# Patient Record
Sex: Male | Born: 1985 | Race: White | Hispanic: No | Marital: Single | Smoking: Current every day smoker
Health system: Southern US, Community
[De-identification: ages and names within clinical notes are randomized; demographics above are authoritative.]

## PROBLEM LIST (undated history)

## (undated) DIAGNOSIS — F32A Depression, unspecified: Secondary | ICD-10-CM

## (undated) DIAGNOSIS — K219 Gastro-esophageal reflux disease without esophagitis: Secondary | ICD-10-CM

## (undated) DIAGNOSIS — F329 Major depressive disorder, single episode, unspecified: Secondary | ICD-10-CM

## (undated) HISTORY — PX: ABDOMINAL SURGERY: SHX537

## (undated) HISTORY — PX: OTHER SURGICAL HISTORY: SHX169

---
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
## 2006-12-04 ENCOUNTER — Emergency Department (HOSPITAL_COMMUNITY): Admission: EM | Admit: 2006-12-04 | Discharge: 2006-12-04 | Payer: Self-pay | Admitting: Family Medicine

## 2006-12-16 ENCOUNTER — Emergency Department (HOSPITAL_COMMUNITY): Admission: EM | Admit: 2006-12-16 | Discharge: 2006-12-16 | Payer: Self-pay | Admitting: Emergency Medicine

## 2007-10-21 ENCOUNTER — Emergency Department (HOSPITAL_COMMUNITY): Admission: EM | Admit: 2007-10-21 | Discharge: 2007-10-22 | Payer: Self-pay | Admitting: Emergency Medicine

## 2007-11-30 ENCOUNTER — Emergency Department (HOSPITAL_COMMUNITY): Admission: EM | Admit: 2007-11-30 | Discharge: 2007-11-30 | Payer: Self-pay | Admitting: Emergency Medicine

## 2007-11-30 ENCOUNTER — Ambulatory Visit: Payer: Self-pay | Admitting: Psychiatry

## 2007-12-01 ENCOUNTER — Inpatient Hospital Stay (HOSPITAL_COMMUNITY): Admission: AD | Admit: 2007-12-01 | Discharge: 2007-12-04 | Payer: Self-pay | Admitting: Psychiatry

## 2009-06-19 ENCOUNTER — Emergency Department (HOSPITAL_COMMUNITY): Admission: EM | Admit: 2009-06-19 | Discharge: 2009-06-19 | Payer: Self-pay | Admitting: Emergency Medicine

## 2010-03-03 ENCOUNTER — Emergency Department (HOSPITAL_COMMUNITY): Admission: EM | Admit: 2010-03-03 | Discharge: 2010-03-03 | Payer: Self-pay | Admitting: Emergency Medicine

## 2010-04-12 ENCOUNTER — Emergency Department: Payer: Self-pay | Admitting: Emergency Medicine

## 2010-09-30 ENCOUNTER — Emergency Department (HOSPITAL_COMMUNITY): Admission: EM | Admit: 2010-09-30 | Discharge: 2010-03-11 | Payer: Self-pay | Admitting: Emergency Medicine

## 2010-10-13 ENCOUNTER — Emergency Department (HOSPITAL_COMMUNITY)
Admission: EM | Admit: 2010-10-13 | Discharge: 2010-10-13 | Payer: Self-pay | Source: Home / Self Care | Admitting: Emergency Medicine

## 2010-10-18 ENCOUNTER — Emergency Department: Payer: Self-pay | Admitting: Emergency Medicine

## 2010-11-21 ENCOUNTER — Emergency Department (HOSPITAL_COMMUNITY)
Admission: EM | Admit: 2010-11-21 | Discharge: 2010-11-21 | Payer: Self-pay | Source: Home / Self Care | Admitting: Emergency Medicine

## 2010-11-21 ENCOUNTER — Emergency Department (HOSPITAL_COMMUNITY)
Admission: EM | Admit: 2010-11-21 | Discharge: 2010-11-22 | Payer: Self-pay | Source: Home / Self Care | Admitting: Emergency Medicine

## 2011-01-29 LAB — DIFFERENTIAL
Basophils Absolute: 0 10*3/uL (ref 0.0–0.1)
Basophils Relative: 1 % (ref 0–1)
Eosinophils Absolute: 0 10*3/uL (ref 0.0–0.7)
Monocytes Absolute: 0.4 10*3/uL (ref 0.1–1.0)
Neutro Abs: 7 10*3/uL (ref 1.7–7.7)

## 2011-01-29 LAB — CBC
HCT: 51.2 % (ref 39.0–52.0)
Hemoglobin: 17.5 g/dL — ABNORMAL HIGH (ref 13.0–17.0)
MCHC: 34.2 g/dL (ref 30.0–36.0)
MCV: 97.3 fL (ref 78.0–100.0)
RBC: 5.26 MIL/uL (ref 4.22–5.81)

## 2011-01-29 LAB — URINALYSIS, ROUTINE W REFLEX MICROSCOPIC
Glucose, UA: NEGATIVE mg/dL
Urobilinogen, UA: 1 mg/dL (ref 0.0–1.0)

## 2011-01-29 LAB — BASIC METABOLIC PANEL
Chloride: 103 mEq/L (ref 96–112)
Potassium: 4.7 mEq/L (ref 3.5–5.1)
Sodium: 138 mEq/L (ref 135–145)

## 2011-01-29 LAB — POCT I-STAT, CHEM 8
Creatinine, Ser: 1 mg/dL (ref 0.4–1.5)
Hemoglobin: 19 g/dL — ABNORMAL HIGH (ref 13.0–17.0)
Potassium: 4.5 mEq/L (ref 3.5–5.1)
Sodium: 139 mEq/L (ref 135–145)
TCO2: 26 mmol/L (ref 0–100)

## 2011-02-12 ENCOUNTER — Emergency Department: Payer: Self-pay | Admitting: Internal Medicine

## 2011-02-24 ENCOUNTER — Emergency Department (HOSPITAL_COMMUNITY)
Admission: EM | Admit: 2011-02-24 | Discharge: 2011-02-24 | Disposition: A | Discharge status: Home / Self Care | Payer: Self-pay | Attending: Emergency Medicine | Admitting: Emergency Medicine

## 2011-02-24 DIAGNOSIS — H60399 Other infective otitis externa, unspecified ear: Secondary | ICD-10-CM | POA: Insufficient documentation

## 2011-02-24 DIAGNOSIS — F329 Major depressive disorder, single episode, unspecified: Secondary | ICD-10-CM | POA: Insufficient documentation

## 2011-02-24 DIAGNOSIS — R220 Localized swelling, mass and lump, head: Secondary | ICD-10-CM | POA: Insufficient documentation

## 2011-02-24 DIAGNOSIS — J3489 Other specified disorders of nose and nasal sinuses: Secondary | ICD-10-CM | POA: Insufficient documentation

## 2011-02-24 DIAGNOSIS — F3289 Other specified depressive episodes: Secondary | ICD-10-CM | POA: Insufficient documentation

## 2011-02-24 DIAGNOSIS — L723 Sebaceous cyst: Secondary | ICD-10-CM | POA: Insufficient documentation

## 2011-02-24 DIAGNOSIS — H9209 Otalgia, unspecified ear: Secondary | ICD-10-CM | POA: Insufficient documentation

## 2011-02-24 DIAGNOSIS — R070 Pain in throat: Secondary | ICD-10-CM | POA: Insufficient documentation

## 2011-02-24 DIAGNOSIS — R221 Localized swelling, mass and lump, neck: Secondary | ICD-10-CM | POA: Insufficient documentation

## 2011-02-24 DIAGNOSIS — R51 Headache: Secondary | ICD-10-CM | POA: Insufficient documentation

## 2011-02-24 LAB — RAPID URINE DRUG SCREEN, HOSP PERFORMED
Amphetamines: NOT DETECTED
Barbiturates: NOT DETECTED
Benzodiazepines: POSITIVE — AB
Cocaine: NOT DETECTED
Opiates: NOT DETECTED
Tetrahydrocannabinol: POSITIVE — AB

## 2011-02-25 ENCOUNTER — Emergency Department (HOSPITAL_COMMUNITY)
Admission: EM | Admit: 2011-02-25 | Discharge: 2011-02-25 | Disposition: A | Discharge status: Home / Self Care | Payer: Self-pay | Attending: Emergency Medicine | Admitting: Emergency Medicine

## 2011-02-25 DIAGNOSIS — Z139 Encounter for screening, unspecified: Secondary | ICD-10-CM | POA: Insufficient documentation

## 2011-02-25 DIAGNOSIS — F1210 Cannabis abuse, uncomplicated: Secondary | ICD-10-CM | POA: Insufficient documentation

## 2011-03-08 NOTE — H&P (Signed)
NAMEJARION, [name]              ACCOUNT NO.:  [id number]   MEDICAL RECORD NO.:  [id number]          PATIENT TYPE:  IPS   LOCATION:  0307                          FACILITY[facility]  [facility]   PHYSICIAN:  Anselm Jungling, MD  DATE OF BIRTH:  12-23-1985   DATE OF ADMISSION:  12/01/2007  DATE OF DISCHARGE:                       PSYCHIATRIC ADMISSION ASSESSMENT   This is an involuntary admission to the services of Dr. Geralyn Flash.  Today's date is December 01, 2007.  He presented to the emergency  department at [facility] last night around 6 p.m.  He reported that he  had attempted to cut his wrist and right antecubital with a razor to  commit suicide.  He stated he was depressed over a break-up with a  girlfriend and stated that he has attempted suicide several times in the  past.  Of note, his UDS was positive for opiates, benzodiazepines and  marijuana he is not prescribed.  He states that he is addicted to  opiates.  He underwent left thumb surgery a couple years ago and he  states he does not abuse his Xanax but he was last prescribed his Xanax  about 2 years ago.  He gets most of his drugs off the street.  Today he  is irritable, he is difficult to have stay engaged in conversation and  basically he wants his Xanax.  At first he denies any ability to take  any antidepressants and eventually relents and states that he will give  Cymbalta a try.  He said in the past Celexa weirded him out.   PAST PSYCHIATRIC HISTORY:  He does have one prior admission in 2006 to  [facility].  He was admitted for polysubstance and  apparently did the [facility] Program.   SOCIAL HISTORY:  1. He is a high school graduate in 2007.  2. He works in Aeronautical engineer.  3. He lives with his mother.   FAMILY HISTORY:  He states his Mom is addicted to pain medicines as  well.   ALCOHOL AND DRUG HISTORY:  He reports being actually addicted for about  6 months or so.   PRIMARY CARE M[place]hai Venuto:   None.   In 2007, he is status post torn ligament repair in his left thumb and he  states that he needs to have that released.   MEDICAL PROBLEMS:  He did have a couple staples put in his right  antecubital and right wrist area.  His left wrist area does not have any  staples.  Otherwise, medically there are no current problems.   MEDICATIONS:  None are prescribed.   DRUG ALLERGIES:  He has no known drug allergies.   PHYSICAL EXAM:  He was medically cleared in the ED at [facility].  As  already stated, his urine drug screen was positive for opiates,  benzodiazepines, marijuana.   LABS:  His alcohol level was 7 and he had no other remarkable findings  on his labs.   On admission to our unit, his vital signs show he is 5 feet 9 inches,  weighs 129, temperature is 97.5, blood pressure  is 112/78 to 121/72,  pulse is 50-73, and respirations are 18.  He does have an old surgical  scar on his abdomen which sounds like it was a pyloric stenosis release  and he has no other remarkable findings.  This morning he fell, he is  reporting left wrist and thumb pain and, of course, this is his injured  hand anyway.  He does not have any swelling, he can touch his fingertips  to his thumb tip and his pain is felt to be reflective of his  withdrawal.   MENTAL STATUS EXAM:  He is alert and oriented.  He is surprisingly  appropriately groomed, dressed and appeared to at least be adequately  nourished.  His speech today is not pressured, his mood is depressed and  irritable.  His affect is anxious.  His thought processes are not clear  or rational.  He is being driven by his withdrawal.  Judgment and  insight are only fair.  Concentration and memory are fair.  Intelligence  is average.  He denies being actively suicidal or homicidal.  He denies  auditory visual hallucinations.  He is requesting Suboxone.   DIAGNOSES:  Axis I:  Opioid dependence, benzodiazepine abuse, rule out dependence.   Marijuana abuse, rule out dependence.  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  Axis II:  Rule out personality disorder.  Axis III:  Status post left thumb tendon repair in 2007.  Reports continued pain.  Axis IV:  Moderate.  Job loss, break-up, etcetera.  Axis V:  Global Assessment of Functioning 20.   PLAN:  1. Admit to help through detoxification from the opiates,      benzodiazepines and marijuana.  2. We will initiate therapy to help with his reported panic attacks.  3. He finally is agreeable to a trial of Cymbalta.  He is requesting      Suboxone and we will have a case manager try to find long-term drug      rehab for him consistent with his request for Suboxone.   ESTIMATED LENGTH OF STAY:  3 to 4 days.      Mickie Leonarda Salon, P.A.-C.      Anselm Jungling, MD  Electronically Signed    MD/MEDQ  D:  12/01/2007  T:  12/02/2007  Job:  [phone] [id number]

## 2011-03-11 NOTE — Op Note (Signed)
[name], [name]              ACCOUNT NO.:  [id number]   MEDICAL RECORD NO.:  [id number]          PATIENT TYPE:  INP   LOCATION:  1825                         FACILITY:  [facility]   PHYSICIAN:  Harvie Junior, M.D.   DATE OF BIRTH:  05-13-1986   DATE OF PROCEDURE:  08/17/2005  DATE OF DISCHARGE:  08/18/2005                                 OPERATIVE REPORT   PREOPERATIVE DIAGNOSIS:  Painful left thumb status post fall with volar  subluxation.   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   PROCEDURE:  1.  Repair of extensor pollicus brevis.  2.  Cuniorrhaphy of extensor pollicus longus with shortening to re-establish      tension.  3.  Pinning of the metacarpal phalangeal joint in a reduced position.  4.  Advancement of the adductor pollicus brevis.   SURGEON:  Harvie Junior, M.D.   ASSISTANT:  Marshia Ly, P.A.-C.   ANESTHESIA:  General.   BRIEF HISTORY:  [name] is an 25 year old boy who was initially seen  over at Urgent Care at [facility].  He was seen by a physician there who ran an x-  ray which showed volar subluxation.  He ultimately talked to the patient  about this, did a digital block, and tried manipulative reduction.  No  reduction was able to be achieved.  I came to see the patient and had a long  talk with him specifically about whether he had had any previous history of  injury to his thumb.  It was certainly seen that his thumb remained in a  flexed attitude, but he was hyperlax and it seemed like it could be manually  reduced.  He said this is significantly painful and his thumb had never  really been in this flexed position before.  With that information, it was  thought that there may be some cartilage which was trapped within the  metacarpal phalangeal joint, could possibly be volar plate trapped in the  joint, and we ultimately felt that surgical intervention would be  appropriate.  The patient was taken  for that procedure.   DESCRIPTION OF PROCEDURE:  The patient was taken to the operating room and  after adequate anesthesia was obtained with general anesthesia, the patient  was placed on the operating table.  The left hand was then evaluated and  examined under anesthesia with the fluoroscopy machine.  Basically, at that  point, it did look as though we could reduce the metacarpal phalangeal joint  but it would not stay in the reduced position.  It was, again, felt that  there was something that was trapped within the joint and that this needed  to be exposed and evaluated.  At that point, the arm was prepped and draped  in the usual sterile fashion.  The arm was exsanguinated and the tourniquet  was inflated to 250 mmHg.  Following this, a curved incision was made over  the dorsal aspect of the joint.  The subcutaneous tissues was incised.  The  extensor pollicus longus was clearly identified.  The extensor  pollicus  brevis was then searched for but was not apparent.  There was a tremendous  laxity within the dorsal capsular structures and they seemed to be somewhat  avulsed off the proximal phalanx.  With looking in the joint through a  radial rent, there was significant laxity of both the radial collateral  ligament and ulnar collateral ligament, although they were intact.  At this  point, it became clear that this was most likely  not a new injury and that  there was no fragments in the joint.  There was no volar plate in the joint  and that this was probably some chronic injury that may have had some acute  exacerbation.  At that point, we really were scrambling for treatment  options and felt that the most appropriate course of action was going to try  to be re-establish appropriate anatomy.  At that point, the extensor  pollicus brevis was identified.  There was basically some scar tissue which  had pulled distally and became hyperlax on the dorsal aspect of the joint.  The  extensor pollicus brevis was tracked proximally and although it was not  the perfect tendon, there were some fibers which were able to be brought  forward.  About 1 cm of adductor pollicus scar type tissue was debrided and  then this remaining portion was advanced to the proximal phalanx.  Prior to  doing this, the joint was manipulated into a reduced position and then  pinned percutaneously with a 62 K-wire.  Once that was accomplished, a 2.2  mm Arthrex suture anchor was used to repair the extensor pollicus brevis.  Once this was accomplished, the joint was now adequately reduced, the brevis  was repaired.  Unfortunately, the IP joint was now being held in somewhat of  a flexed position.  It was felt that this was most likely related to the  fact that with the MP joint previously in a significantly flexed posture  that there was stretching of the extensor pollicus longus.  Once the joint  was reduced, the extensor pollicus longus was now not at a functional  length.  At that point, it was felt that this was going to need to be  shortened and so a cm segment was taken out of the extensor pollicus longus,  the thumb was pulled into extension, and this was repaired with interrupted  nylon suture.  Once this was accomplished, attention was turned to the  abductor pollicus brevis which had previously been released.  The abductor  pollicus brevis was advanced back to the extensor pollicus brevis by way of  repairing over the dorsal capsule in this area and it was advanced to help  provide additional stability to the joint.  To this point, the wound was  copiously irrigated and suctioned dry.  The skin was closed with 4-0 nylon  interrupted suture.  The hand was placed into a thumb spica splint and the  patient was taken to the recovery room where she was noted to be in  satisfactory condition.  Estimated blood loss for the procedure was none. The most interesting part of the case came after the  case when I went out to  speak with the family and when we discussed with them what our thoughts were  about the case, at that point they said that he absolutely had hurt his hand  on multiple occasions before, that he had been to see orthopedic doctors in  the past, that he  always kept his hand in a flexed position and that they  were unclear why he had told us that this was not the case.  At any rate, I  think that the appropriate surgery had been performed for that scenario, but  it was just interesting that the patient had not shared with [place] this  significant history, although he had been pushed at this at length, that was  our initial thought when we looked at his original x-rays and examined him.  At any rate, he was discharged from the hospital that evening and he will be  followed through my office.      Harvie Junior, M.D.  Electronically Signed     JLG/MEDQ  D:  08/18/2005  T:  08/19/2005  Job:  [id number]

## 2011-03-11 NOTE — Discharge Summary (Signed)
NAMEJACY, [name]              ACCOUNT NO.:  [id number]   MEDICAL RECORD NO.:  [id number]          PATIENT TYPE:  IPS   LOCATION:  0307                          [facility]  [facility]   PHYSICIAN:  Anselm Jungling, MD  DATE OF BIRTH:  [birth year]   DATE OF ADMISSION:  12/01/2007  DATE OF DISCHARGE:  12/04/2007                               DISCHARGE SUMMARY   IDENTIFYING DATA AND REASON FOR ADMISSION:  This was an inpatient  psychiatric admission for [name], a 25 year old single white male who  presented with self-inflicted wrist cuts, and request for assistance  with drug addiction.  Please refer to the admission note for further  details pertaining to the symptoms, circumstances and history that led  to his hospitalization.  He was given initial Axis I diagnosis of opioid  dependence, benzodiazepine dependence, cannabis dependence.   MEDICAL AND LABORATORY:  The patient was medically treated for his wrist  wounds prior to being transferred to the inpatient psychiatric service.  These required sutures on both his left and right wrist.  He indicated  that he had made the suicide attempt because he was despondent about  losing his girlfriend and his job.   Otherwise, he was in good health without any active or chronic medical  problems.  He was medically and physically assessed by the psychiatric  nurse practitioner.  His wrist wounds were checked regularly, and they  remained clean and dry.   HOSPITAL COURSE:  The patient was admitted to the adult inpatient  psychiatric service.  He presented as a slender, but normally developed  young male who indicated that he was eager to receive assistance in  getting off of drugs.  He stated that he had become dependent on opiates  following a thumb injury and treatment for same.   His toxicology screen was positive for opiates, benzodiazepines, and  cannabis.   He was placed on the opiate withdrawal protocol and a Librium withdrawal  protocol.  He was involved in the therapeutic milieu including groups  and activities geared towards 12-step recovery.   He was initially evaluated by Dr. Lolly Mustache.  The undersigned assumed his  care on the final hospital day.  On that date, the patient was  complaining of nausea and emesis, but stated that overall I feel  better.  He denied any suicidal ideation.  He indicated that he felt  ready to go home, and made a clear request for discharge.  He indicated  that he was interested in outpatient chemical dependency treatment.  He  also agreed to continue with psychiatric outpatient care.   He had come to [place] on a regimen of Cymbalta 60 mg daily which was  continued throughout his stay and upon discharge.  For aftercare, the  patient agreed to follow up at the [facility], with an appointment  with their psychiatrist on December 13, 2007.   DISCHARGE MEDICATION:  Cymbalta 60 mg daily.  The patient was instructed  not to use any further Xanax.  He was encouraged to work with the  [facility] to find appropriate outpatient chemical dependency  treatment, and to attend 12-step groups in his area.   DISCHARGE DIAGNOSES:  AXIS I:  Opioid dependence, early remission,  polysubstance abuse, substance-induced mood disorder.  AXIS II:  Deferred.  AXIS III:  Status post wrist slash.  AXIS IV:  Stressors severe.  AXIS V:  Global Assessment of Functioning on discharge 55.      Anselm Jungling, MD  Electronically Signed     SPB/MEDQ  D:  12/10/2007  T:  12/11/2007  Job:  [id number]

## 2011-04-29 ENCOUNTER — Emergency Department: Payer: Self-pay | Admitting: Emergency Medicine

## 2011-05-31 ENCOUNTER — Emergency Department (HOSPITAL_COMMUNITY)
Admission: EM | Admit: 2011-05-31 | Discharge: 2011-06-01 | Disposition: A | Discharge status: Home / Self Care | Payer: Self-pay | Attending: Emergency Medicine | Admitting: Emergency Medicine

## 2011-05-31 DIAGNOSIS — H9190 Unspecified hearing loss, unspecified ear: Secondary | ICD-10-CM | POA: Insufficient documentation

## 2011-05-31 DIAGNOSIS — H9210 Otorrhea, unspecified ear: Secondary | ICD-10-CM | POA: Insufficient documentation

## 2011-05-31 DIAGNOSIS — F3289 Other specified depressive episodes: Secondary | ICD-10-CM | POA: Insufficient documentation

## 2011-05-31 DIAGNOSIS — F329 Major depressive disorder, single episode, unspecified: Secondary | ICD-10-CM | POA: Insufficient documentation

## 2011-05-31 DIAGNOSIS — R6884 Jaw pain: Secondary | ICD-10-CM | POA: Insufficient documentation

## 2011-05-31 DIAGNOSIS — Z9889 Other specified postprocedural states: Secondary | ICD-10-CM | POA: Insufficient documentation

## 2011-05-31 DIAGNOSIS — H60399 Other infective otitis externa, unspecified ear: Secondary | ICD-10-CM | POA: Insufficient documentation

## 2011-05-31 DIAGNOSIS — R599 Enlarged lymph nodes, unspecified: Secondary | ICD-10-CM | POA: Insufficient documentation

## 2011-05-31 DIAGNOSIS — H9209 Otalgia, unspecified ear: Secondary | ICD-10-CM | POA: Insufficient documentation

## 2011-05-31 DIAGNOSIS — F411 Generalized anxiety disorder: Secondary | ICD-10-CM | POA: Insufficient documentation

## 2011-06-09 ENCOUNTER — Emergency Department (HOSPITAL_COMMUNITY): Payer: Self-pay

## 2011-06-09 ENCOUNTER — Emergency Department (HOSPITAL_COMMUNITY)
Admission: EM | Admit: 2011-06-09 | Discharge: 2011-06-09 | Discharge status: Against Medical Advice | Payer: Self-pay | Attending: Emergency Medicine | Admitting: Emergency Medicine

## 2011-06-09 DIAGNOSIS — L723 Sebaceous cyst: Secondary | ICD-10-CM | POA: Insufficient documentation

## 2011-06-09 DIAGNOSIS — R51 Headache: Secondary | ICD-10-CM | POA: Insufficient documentation

## 2011-06-09 DIAGNOSIS — F3289 Other specified depressive episodes: Secondary | ICD-10-CM | POA: Insufficient documentation

## 2011-06-09 DIAGNOSIS — F329 Major depressive disorder, single episode, unspecified: Secondary | ICD-10-CM | POA: Insufficient documentation

## 2011-06-09 DIAGNOSIS — R6884 Jaw pain: Secondary | ICD-10-CM | POA: Insufficient documentation

## 2011-06-11 ENCOUNTER — Emergency Department (HOSPITAL_COMMUNITY)
Admission: EM | Admit: 2011-06-11 | Discharge: 2011-06-12 | Disposition: A | Discharge status: Home / Self Care | Payer: Self-pay | Attending: Emergency Medicine | Admitting: Emergency Medicine

## 2011-06-11 DIAGNOSIS — R6884 Jaw pain: Secondary | ICD-10-CM | POA: Insufficient documentation

## 2011-06-11 DIAGNOSIS — M26609 Unspecified temporomandibular joint disorder, unspecified side: Secondary | ICD-10-CM | POA: Insufficient documentation

## 2011-06-11 DIAGNOSIS — R51 Headache: Secondary | ICD-10-CM | POA: Insufficient documentation

## 2011-06-11 DIAGNOSIS — L723 Sebaceous cyst: Secondary | ICD-10-CM | POA: Insufficient documentation

## 2011-06-12 ENCOUNTER — Inpatient Hospital Stay (INDEPENDENT_AMBULATORY_CARE_PROVIDER_SITE_OTHER)
Admission: RE | Admit: 2011-06-12 | Discharge: 2011-06-12 | Disposition: A | Discharge status: Home / Self Care | Payer: Self-pay | Source: Ambulatory Visit | Attending: Family Medicine | Admitting: Family Medicine

## 2011-06-12 DIAGNOSIS — L723 Sebaceous cyst: Secondary | ICD-10-CM

## 2011-06-21 ENCOUNTER — Emergency Department (HOSPITAL_COMMUNITY)
Admission: EM | Admit: 2011-06-21 | Discharge: 2011-06-21 | Disposition: A | Discharge status: Home / Self Care | Payer: Self-pay | Attending: Emergency Medicine | Admitting: Emergency Medicine

## 2011-06-21 DIAGNOSIS — F1910 Other psychoactive substance abuse, uncomplicated: Secondary | ICD-10-CM | POA: Insufficient documentation

## 2011-06-21 LAB — DIFFERENTIAL
Basophils Relative: 0 % (ref 0–1)
Eosinophils Relative: 1 % (ref 0–5)
Lymphocytes Relative: 13 % (ref 12–46)
Monocytes Absolute: 0.5 10*3/uL (ref 0.1–1.0)
Monocytes Relative: 5 % (ref 3–12)
Neutro Abs: 7.5 10*3/uL (ref 1.7–7.7)
Neutrophils Relative %: 81 % — ABNORMAL HIGH (ref 43–77)

## 2011-06-21 LAB — RAPID URINE DRUG SCREEN, HOSP PERFORMED
Barbiturates: NOT DETECTED
Benzodiazepines: POSITIVE — AB
Cocaine: POSITIVE — AB
Tetrahydrocannabinol: POSITIVE — AB

## 2011-06-21 LAB — COMPREHENSIVE METABOLIC PANEL
AST: 29 U/L (ref 0–37)
BUN: 19 mg/dL (ref 6–23)
CO2: 24 mEq/L (ref 19–32)
Chloride: 107 mEq/L (ref 96–112)
GFR calc Af Amer: >60 mL/min (ref >60)
Glucose, Bld: 94 mg/dL (ref 70–99)

## 2011-06-21 LAB — CBC
HCT: 40.5 % (ref 39.0–52.0)
RDW: 12.4 % (ref 11.5–15.5)
WBC: 9.3 10*3/uL (ref 4.0–10.5)

## 2011-06-21 LAB — ETHANOL: Alcohol, Ethyl (B): <11 mg/dL (ref 0–11)

## 2011-06-21 LAB — ACETAMINOPHEN LEVEL: Acetaminophen (Tylenol), Serum: <15 ug/mL (ref 10–30)

## 2011-07-10 ENCOUNTER — Emergency Department (HOSPITAL_COMMUNITY): Payer: Self-pay

## 2011-07-10 ENCOUNTER — Emergency Department (HOSPITAL_COMMUNITY)
Admission: EM | Admit: 2011-07-10 | Discharge: 2011-07-10 | Disposition: A | Discharge status: Home / Self Care | Payer: Self-pay | Attending: Emergency Medicine | Admitting: Emergency Medicine

## 2011-07-10 DIAGNOSIS — Z87891 Personal history of nicotine dependence: Secondary | ICD-10-CM | POA: Insufficient documentation

## 2011-07-10 DIAGNOSIS — S6390XA Sprain of unspecified part of unspecified wrist and hand, initial encounter: Secondary | ICD-10-CM | POA: Insufficient documentation

## 2011-07-15 LAB — I-STAT 8, (EC8 V) (CONVERTED LAB)
BUN: 14
Bicarbonate: 26.4 — ABNORMAL HIGH
Chloride: 109
Operator id: 192351
pCO2, Ven: 48.2
pH, Ven: 7.346 — ABNORMAL HIGH

## 2011-07-15 LAB — RAPID URINE DRUG SCREEN, HOSP PERFORMED
Amphetamines: NOT DETECTED
Benzodiazepines: POSITIVE — AB
Cocaine: NOT DETECTED
Opiates: POSITIVE — AB
Tetrahydrocannabinol: POSITIVE — AB

## 2011-07-15 LAB — ETHANOL: Alcohol, Ethyl (B): 7

## 2011-08-21 ENCOUNTER — Emergency Department (HOSPITAL_COMMUNITY)
Admission: EM | Admit: 2011-08-21 | Discharge: 2011-08-21 | Disposition: A | Discharge status: Home / Self Care | Payer: Self-pay | Attending: Emergency Medicine | Admitting: Emergency Medicine

## 2011-08-21 ENCOUNTER — Emergency Department (HOSPITAL_COMMUNITY): Payer: Self-pay

## 2011-08-21 DIAGNOSIS — F329 Major depressive disorder, single episode, unspecified: Secondary | ICD-10-CM | POA: Insufficient documentation

## 2011-08-21 DIAGNOSIS — S6990XA Unspecified injury of unspecified wrist, hand and finger(s), initial encounter: Secondary | ICD-10-CM | POA: Insufficient documentation

## 2011-08-21 DIAGNOSIS — S6710XA Crushing injury of unspecified finger(s), initial encounter: Secondary | ICD-10-CM | POA: Insufficient documentation

## 2011-08-21 DIAGNOSIS — W230XXA Caught, crushed, jammed, or pinched between moving objects, initial encounter: Secondary | ICD-10-CM | POA: Insufficient documentation

## 2011-08-21 DIAGNOSIS — Z79899 Other long term (current) drug therapy: Secondary | ICD-10-CM | POA: Insufficient documentation

## 2011-08-21 DIAGNOSIS — M79609 Pain in unspecified limb: Secondary | ICD-10-CM | POA: Insufficient documentation

## 2011-08-21 DIAGNOSIS — S61209A Unspecified open wound of unspecified finger without damage to nail, initial encounter: Secondary | ICD-10-CM | POA: Insufficient documentation

## 2011-08-21 DIAGNOSIS — F3289 Other specified depressive episodes: Secondary | ICD-10-CM | POA: Insufficient documentation

## 2011-11-24 ENCOUNTER — Encounter (HOSPITAL_COMMUNITY): Payer: Self-pay | Admitting: Emergency Medicine

## 2011-11-24 ENCOUNTER — Emergency Department (HOSPITAL_COMMUNITY)
Admission: EM | Admit: 2011-11-24 | Discharge: 2011-11-25 | Disposition: A | Discharge status: Home / Self Care | Payer: Self-pay | Attending: Emergency Medicine | Admitting: Emergency Medicine

## 2011-11-24 DIAGNOSIS — N390 Urinary tract infection, site not specified: Secondary | ICD-10-CM | POA: Insufficient documentation

## 2011-11-24 DIAGNOSIS — R109 Unspecified abdominal pain: Secondary | ICD-10-CM | POA: Insufficient documentation

## 2011-11-24 DIAGNOSIS — R112 Nausea with vomiting, unspecified: Secondary | ICD-10-CM | POA: Insufficient documentation

## 2011-11-24 LAB — URINALYSIS, ROUTINE W REFLEX MICROSCOPIC
Ketones, ur: 15 mg/dL — AB
Nitrite: NEGATIVE
Protein, ur: NEGATIVE mg/dL
Urobilinogen, UA: 1 mg/dL (ref 0.0–1.0)

## 2011-11-24 LAB — BASIC METABOLIC PANEL
BUN: 12 mg/dL (ref 6–23)
Creatinine, Ser: 0.88 mg/dL (ref 0.50–1.35)
GFR calc Af Amer: >90 mL/min (ref >90)
GFR calc non Af Amer: >90 mL/min (ref >90)
Potassium: 3.2 mEq/L — ABNORMAL LOW (ref 3.5–5.1)

## 2011-11-24 LAB — DIFFERENTIAL
Basophils Absolute: 0 10*3/uL (ref 0.0–0.1)
Basophils Relative: 0 % (ref 0–1)
Eosinophils Absolute: 0 10*3/uL (ref 0.0–0.7)
Monocytes Relative: 7 % (ref 3–12)
Neutrophils Relative %: 75 % (ref 43–77)

## 2011-11-24 LAB — URINE MICROSCOPIC-ADD ON

## 2011-11-24 LAB — CBC
Hemoglobin: 17 g/dL (ref 13.0–17.0)
MCH: 32.8 pg (ref 26.0–34.0)
MCHC: 36.4 g/dL — ABNORMAL HIGH (ref 30.0–36.0)
Platelets: 246 10*3/uL (ref 150–400)
RDW: 12.4 % (ref 11.5–15.5)

## 2011-11-24 MED ORDER — POTASSIUM CHLORIDE CRYS ER 20 MEQ PO TBCR
20.0000 meq | EXTENDED_RELEASE_TABLET | Freq: Two times a day (BID) | ORAL | Status: DC
  Administered 2011-11-24: 20 meq via ORAL
  Filled 2011-11-24: qty 1

## 2011-11-24 MED ORDER — GI COCKTAIL ~~LOC~~
30.0000 mL | Freq: Once | ORAL | Status: AC
  Administered 2011-11-24: 30 mL via ORAL
  Filled 2011-11-24: qty 30

## 2011-11-24 MED ORDER — KETOROLAC TROMETHAMINE 30 MG/ML IJ SOLN
30.0000 mg | Freq: Once | INTRAMUSCULAR | Status: AC
  Administered 2011-11-24: 30 mg via INTRAVENOUS
  Filled 2011-11-24: qty 1

## 2011-11-24 MED ORDER — POTASSIUM CHLORIDE 20 MEQ PO PACK: 20.0000 meq | PACK | Freq: Two times a day (BID) | ORAL | Status: DC

## 2011-11-24 MED ORDER — PROMETHAZINE HCL 25 MG PO TABS: 25.0000 mg | ORAL_TABLET | Freq: Four times a day (QID) | ORAL | Status: AC | PRN

## 2011-11-24 MED ORDER — ONDANSETRON HCL 4 MG/2ML IJ SOLN
4.0000 mg | Freq: Once | INTRAMUSCULAR | Status: AC
  Administered 2011-11-24: 4 mg via INTRAVENOUS
  Filled 2011-11-24: qty 2

## 2011-11-24 MED ORDER — SODIUM CHLORIDE 0.9 % IV BOLUS (SEPSIS)
1000.0000 mL | Freq: Once | INTRAVENOUS | Status: AC
  Administered 2011-11-24: 1000 mL via INTRAVENOUS

## 2011-11-24 MED ORDER — CIPROFLOXACIN HCL 500 MG PO TABS: 500.0000 mg | ORAL_TABLET | Freq: Two times a day (BID) | ORAL | Status: AC

## 2011-11-24 MED ORDER — CIPROFLOXACIN IN D5W 400 MG/200ML IV SOLN
400.0000 mg | Freq: Once | INTRAVENOUS | Status: AC
  Administered 2011-11-24: 400 mg via INTRAVENOUS
  Filled 2011-11-24: qty 200

## 2011-11-24 NOTE — ED Provider Notes (Signed)
History     CSN: [id number]  Arrival date & time 11/24/11  4098   First MD Initiated Contact with Patient 11/24/11 2131      Chief Complaint  Patient presents with  . Abdominal Pain    HPI The patient presents with 3-4 weeks of abdominal pain and nausea.  He notes his symptoms began gradually.  Since onset there has been waxing/waning.  No clear exacerbating or alleviating factors, including by mouth intake.  He notes mild subjective fever, nausea persistently.  He notes anorexia since that time.  No chest pain, no dyspnea, no musculoskeletal changes. The patient is a smoker, denies illicit substances or alcohol  History reviewed. No pertinent past medical history.  History reviewed. No pertinent past surgical history.  No family history on file.  History  Substance Use Topics  . Smoking status: Never Smoker   . Smokeless tobacco: Not on file  . Alcohol Use: No   patient does smoke    Review of Systems  Constitutional:       Per HPI, otherwise negative  HENT:       Per HPI, otherwise negative  Eyes: Negative.   Respiratory:       Per HPI, otherwise negative  Cardiovascular:       Per HPI, otherwise negative  Gastrointestinal: Positive for nausea, vomiting and diarrhea. Negative for constipation and rectal pain.  Genitourinary: Negative.   Musculoskeletal:       Per HPI, otherwise negative  Skin: Negative.   Neurological: Negative for syncope.    Allergies  Ibuprofen and Tylenol  Home Medications  No current outpatient prescriptions on file.  BP 115/65  Pulse 78  Temp(Src) 98.7 F (37.1 C) (Oral)  SpO2 99%  Physical Exam  Nursing note and vitals reviewed. Constitutional: He is oriented to person, place, and time. He appears well-developed. No distress.  HENT:  Head: Normocephalic and atraumatic.  Eyes: Conjunctivae and EOM are normal.  Cardiovascular: Normal rate and regular rhythm.   Pulmonary/Chest: Effort normal. No stridor. No respiratory  distress.  Abdominal: Soft. Normal appearance. He exhibits no distension. There is no hepatosplenomegaly. There is tenderness in the epigastric area. There is no rebound and no CVA tenderness. No hernia.  Musculoskeletal: He exhibits no edema.  Neurological: He is alert and oriented to person, place, and time.  Skin: Skin is warm and dry.  Psychiatric: He has a normal mood and affect.    ED Course  Procedures (including critical care time)  Labs Reviewed  URINALYSIS, ROUTINE W REFLEX MICROSCOPIC - Abnormal; Notable for the following:    Color, Urine AMBER (*) BIOCHEMICALS MAY BE AFFECTED BY COLOR   APPearance CLOUDY (*)    Glucose, UA 100 (*)    Bilirubin Urine SMALL (*)    Ketones, ur 15 (*)    Leukocytes, UA SMALL (*)    All other components within normal limits  CBC - Abnormal; Notable for the following:    MCHC 36.4 (*)    All other components within normal limits  BASIC METABOLIC PANEL - Abnormal; Notable for the following:    Potassium 3.2 (*)    All other components within normal limits  DIFFERENTIAL  URINE MICROSCOPIC-ADD ON   No results found.   No diagnosis found.    MDM  This previously well young male now presents with several weeks of intermittent mid abdominal pain with nausea and vomiting.  On exam the patient is in no distress, with unremarkable vital signs.  Patient  is mild tenderness to palpation about his left mid abdomen.  The patient's labs are notable for evidence of dehydration, hypokalemia, ketonuria and possibly urinary tract infection.  A prolonged discussion was conducted with the patient and his mother regarding the possible causes of his symptoms.  Given the patient's stable vital signs, and the chronic description of his complaints he is appropriate for continued evaluation as an outpatient.  He was advised to follow up with gastroenterology in the primary care physician.  The patient notes he is monitored concerns and he had difficulty seeing a  gastroenterologist.  He was discharged on antibiotics, antiemetics.        Gerhard Munch, MD 11/24/11 2356

## 2011-11-24 NOTE — ED Notes (Signed)
PT. REPORTS INTERMITTENT MID ABDOMINAL PAIN AND VOMITTING FOR SEVERAL WEEKS ,DENIES DIARRHEA OR FEVER.

## 2011-11-25 MED ORDER — HYDROMORPHONE HCL PF 1 MG/ML IJ SOLN
0.5000 mg | Freq: Once | INTRAMUSCULAR | Status: AC
  Administered 2011-11-25: 0.5 mg via INTRAVENOUS
  Filled 2011-11-25: qty 1

## 2011-11-30 ENCOUNTER — Encounter (HOSPITAL_COMMUNITY): Payer: Self-pay | Admitting: *Deleted

## 2011-11-30 ENCOUNTER — Emergency Department (HOSPITAL_COMMUNITY)
Admission: EM | Admit: 2011-11-30 | Discharge: 2011-11-30 | Disposition: A | Discharge status: Home / Self Care | Payer: Self-pay | Attending: Emergency Medicine | Admitting: Emergency Medicine

## 2011-11-30 DIAGNOSIS — Y9351 Activity, roller skating (inline) and skateboarding: Secondary | ICD-10-CM | POA: Insufficient documentation

## 2011-11-30 DIAGNOSIS — F411 Generalized anxiety disorder: Secondary | ICD-10-CM | POA: Insufficient documentation

## 2011-11-30 DIAGNOSIS — IMO0002 Reserved for concepts with insufficient information to code with codable children: Secondary | ICD-10-CM | POA: Insufficient documentation

## 2011-11-30 DIAGNOSIS — S0003XA Contusion of scalp, initial encounter: Secondary | ICD-10-CM | POA: Insufficient documentation

## 2011-11-30 DIAGNOSIS — S01511A Laceration without foreign body of lip, initial encounter: Secondary | ICD-10-CM

## 2011-11-30 DIAGNOSIS — S01501A Unspecified open wound of lip, initial encounter: Secondary | ICD-10-CM | POA: Insufficient documentation

## 2011-11-30 DIAGNOSIS — R51 Headache: Secondary | ICD-10-CM | POA: Insufficient documentation

## 2011-11-30 MED ORDER — CEPHALEXIN 500 MG PO CAPS: 500.0000 mg | ORAL_CAPSULE | Freq: Four times a day (QID) | ORAL | Status: AC

## 2011-11-30 MED ORDER — CEPHALEXIN 500 MG PO CAPS: 500.0000 mg | ORAL_CAPSULE | Freq: Four times a day (QID) | ORAL | Status: DC

## 2011-11-30 NOTE — ED Notes (Signed)
Pt was skateboarding when he fell and a piece of wood impaled into his upper lip, pt states that he removed this wood.  Pt has a laceration that appears to be through his right upper lip.  Bleeding controlled at this time.  Pt is tearful in triage.  Last tetanus was in December 2012

## 2011-11-30 NOTE — ED Notes (Signed)
Pt laceration has been dressed.

## 2011-11-30 NOTE — ED Provider Notes (Signed)
History     CSN: [id number]  Arrival date & time 11/30/11  1733   First MD Initiated Contact with Patient 11/30/11 1823      Chief Complaint  Patient presents with  . Facial Laceration    (Consider location/radiation/quality/duration/timing/severity/associated sxs/prior treatment) Patient is a 26 y.o. male presenting with skin laceration. The history is provided by the patient.  Laceration  The incident occurred less than 1 hour ago. The laceration is located on the face. The laceration is 4 cm in size. The pain is at a severity of 10/10. The pain is moderate. The pain has been constant since onset.  Pt states he was skateboarding when he fell, hitting his head on the skateboard. States laceration in right upper lip, through and through. Pain to right face. Abrasion to the right hip. No LOC. No headache, nausea, vomiting.   History reviewed. No pertinent past medical history.  Past Surgical History  Procedure Date  . Thumb surgery   . Abdominal surgery     No family history on file.  History  Substance Use Topics  . Smoking status: Never Smoker   . Smokeless tobacco: Not on file  . Alcohol Use: No      Review of Systems  Skin:       Laceration, contusion  All other systems reviewed and are negative.    Allergies  Ibuprofen; Tylenol; and Ultram  Home Medications   Current Outpatient Rx  Name Route Sig Dispense Refill  . CIPROFLOXACIN HCL 500 MG PO TABS Oral Take 1 tablet (500 mg total) by mouth every 12 (twelve) hours. 10 tablet 0  . CLONAZEPAM 1 MG PO TABS Oral Take 1 mg by mouth 3 (three) times daily as needed. anxiety    . PROMETHAZINE HCL 25 MG PO TABS Oral Take 1 tablet (25 mg total) by mouth every 6 (six) hours as needed for nausea. 12 tablet 0    BP 109/65  Pulse 102  Temp(Src) 98.1 F (36.7 C) (Oral)  Resp 20  SpO2 98%  Physical Exam  Nursing note and vitals reviewed. Constitutional: He is oriented to person, place, and time. He appears  well-developed and well-nourished.       Tearful, anxious.  HENT:  Head: Normocephalic.  Nose: Nose normal.  Mouth/Throat: Oropharynx is clear and moist.       Contusion to the right cheek. Laceration about 4cm to the right upper lip, just touching the vermillian border, through and through. No dental injury. Abrasion to the right upper gum.   Eyes: Conjunctivae are normal. Pupils are equal, round, and reactive to light.  Neck: Neck supple.  Cardiovascular: Normal rate, regular rhythm and normal heart sounds.   Pulmonary/Chest: Effort normal and breath sounds normal.  Neurological: He is alert and oriented to person, place, and time.  Skin: Skin is warm and dry.  Psychiatric:       anxious    ED Course  Procedures (including critical care time)  LACERATION REPAIR Performed by: Lottie Mussel Authorized by: Jaynie Crumble A Consent: Verbal consent obtained. Risks and benefits: risks, benefits and alternatives were discussed Consent given by: patient Patient identity confirmed: provided demographic data Prepped and Draped in normal sterile fashion Wound explored  Laceration Location: right upper lip  Laceration Length: 4cm  No Foreign Bodies seen or palpated  Anesthesia: local infiltration 2% w/o epinephrine  Anesthetic total: 3 ml  Irrigation method: syringe Amount of cleaning: standard  Skin closure: prolene 6.0, vicryl rapid 5.0  Number of sutures: 7 skin closure, one through vermillian border, 2 on mucosal surface Technique: simple interrupted.  Patient tolerance: Patient tolerated the procedure well with no immediate complications.  Exam does not demonstrate any signs of serious head trauma, no LOC. Laceration repaired. Will d/c home with pain meds and antibiotics  7:08 PM Pt discharged, requesting pain medications. States he is allergic to ibuprofen, tylenol. Vicodin, percocet. Asking for medicinal marijuana as well. Pt appears intoxicated at this  time. Although appropriate, i do suspect pt has taken something prior his visit and do not feel it is safe to prescribed narcotics at this time.   No diagnosis found.    MDM          Lottie Mussel, PA 11/30/11 1903  Lottie Mussel, PA 11/30/11 1909

## 2011-12-01 NOTE — ED Provider Notes (Signed)
Medical screening examination/treatment/procedure(s) were performed by non-physician practitioner and as supervising physician I was immediately available for consultation/collaboration.  Toy Baker, MD 12/01/11 [phone]

## 2011-12-13 ENCOUNTER — Emergency Department (HOSPITAL_COMMUNITY)
Admission: EM | Admit: 2011-12-13 | Discharge: 2011-12-13 | Disposition: A | Discharge status: Home / Self Care | Payer: Self-pay | Source: Home / Self Care | Attending: Family Medicine | Admitting: Family Medicine

## 2012-11-22 ENCOUNTER — Encounter (HOSPITAL_COMMUNITY): Payer: Self-pay | Admitting: *Deleted

## 2012-11-22 ENCOUNTER — Emergency Department (HOSPITAL_COMMUNITY)
Admission: EM | Admit: 2012-11-22 | Discharge: 2012-11-22 | Disposition: A | Discharge status: Home / Self Care | Payer: Self-pay | Attending: Emergency Medicine | Admitting: Emergency Medicine

## 2012-11-22 ENCOUNTER — Emergency Department (HOSPITAL_COMMUNITY): Payer: Self-pay

## 2012-11-22 DIAGNOSIS — Z8659 Personal history of other mental and behavioral disorders: Secondary | ICD-10-CM | POA: Insufficient documentation

## 2012-11-22 DIAGNOSIS — S8990XA Unspecified injury of unspecified lower leg, initial encounter: Secondary | ICD-10-CM | POA: Insufficient documentation

## 2012-11-22 DIAGNOSIS — F1210 Cannabis abuse, uncomplicated: Secondary | ICD-10-CM | POA: Insufficient documentation

## 2012-11-22 DIAGNOSIS — Y929 Unspecified place or not applicable: Secondary | ICD-10-CM | POA: Insufficient documentation

## 2012-11-22 DIAGNOSIS — W1809XA Striking against other object with subsequent fall, initial encounter: Secondary | ICD-10-CM | POA: Insufficient documentation

## 2012-11-22 DIAGNOSIS — W540XXA Bitten by dog, initial encounter: Secondary | ICD-10-CM | POA: Insufficient documentation

## 2012-11-22 DIAGNOSIS — M25579 Pain in unspecified ankle and joints of unspecified foot: Secondary | ICD-10-CM

## 2012-11-22 DIAGNOSIS — S61409A Unspecified open wound of unspecified hand, initial encounter: Secondary | ICD-10-CM | POA: Insufficient documentation

## 2012-11-22 DIAGNOSIS — Z8719 Personal history of other diseases of the digestive system: Secondary | ICD-10-CM | POA: Insufficient documentation

## 2012-11-22 DIAGNOSIS — S99929A Unspecified injury of unspecified foot, initial encounter: Secondary | ICD-10-CM | POA: Insufficient documentation

## 2012-11-22 DIAGNOSIS — Y9389 Activity, other specified: Secondary | ICD-10-CM | POA: Insufficient documentation

## 2012-11-22 HISTORY — DX: Gastro-esophageal reflux disease without esophagitis: K21.9

## 2012-11-22 HISTORY — DX: Depression, unspecified: F32.A

## 2012-11-22 HISTORY — DX: Major depressive disorder, single episode, unspecified: F32.9

## 2012-11-22 MED ORDER — NAPROXEN 250 MG PO TABS
500.0000 mg | ORAL_TABLET | Freq: Once | ORAL | Status: AC
  Administered 2012-11-22: 500 mg via ORAL
  Filled 2012-11-22: qty 2

## 2012-11-22 MED ORDER — NAPROXEN 500 MG PO TABS: 500.0000 mg | ORAL_TABLET | Freq: Two times a day (BID) | ORAL | Status: DC

## 2012-11-22 NOTE — ED Notes (Signed)
Pt given discharge paperwork with no additional questions; pt verbalized understanding of discharge; VSS; e-signature obtained; 

## 2012-11-22 NOTE — ED Provider Notes (Signed)
History   Scribed for Gavin Pound. Ghim, MD, the patient was seen in room TR07C/TR07C . This chart was scribed by Lewanda Rife.   CSN: [id number]  Arrival date & time 11/22/12  1623   First MD Initiated Contact with Patient 11/22/12 1805      Chief Complaint  Patient presents with  . Animal Bite  . Joint Swelling    (Consider location/radiation/quality/duration/timing/severity/associated sxs/prior treatment) HPI [name] is a 27 y.o. male who presents to the Emergency Department complaining of pit bull bite and mild left ankle pain onset 4 days. Pt states he was breaking up a pit bull fight and a dog scraped his 2nd finger of right hand making a puncture wound. Pt reports falling backwards and hitting the back of his left ankle on a curb. Pt reports he is familiar with the dog and shots are up to date. Pt has not taken any medications to treat pain or swelling at home. Pt reports tetanus immunization within the last 10 years.   Past Medical History  Diagnosis Date  . Depression   . GERD (gastroesophageal reflux disease)     Past Surgical History  Procedure Date  . Thumb surgery   . Abdominal surgery     No family history on file.  History  Substance Use Topics  . Smoking status: Never Smoker   . Smokeless tobacco: Not on file  . Alcohol Use: No      Review of Systems  Constitutional: Negative.   HENT: Negative.   Respiratory: Negative.   Cardiovascular: Negative.   Gastrointestinal: Negative.   Musculoskeletal: Positive for joint swelling (left ankle).  Skin: Positive for wound (dog bite 2nd finger of right hand ).  Neurological: Negative.   Hematological: Negative.   Psychiatric/Behavioral: Negative.   All other systems reviewed and are negative.   A complete 10 system review of systems was obtained and all systems are negative except as noted in the HPI and PMH.   Allergies  Ibuprofen; Tylenol; and Ultram  Home Medications  No current  outpatient prescriptions on file.  BP 125/70  Pulse 103  Temp 98 F (36.7 C) (Oral)  Resp 20  Ht 5\' 9"  (1.753 m)  Wt 155 lb (70.308 kg)  BMI 22.89 kg/m2  SpO2 98%  Physical Exam  Nursing note and vitals reviewed. Constitutional: He is oriented to person, place, and time. He appears well-developed and well-nourished. No distress.  HENT:  Head: Normocephalic and atraumatic.  Eyes: Conjunctivae normal are normal.  Neck: Neck supple. No tracheal deviation present.  Cardiovascular: Normal rate and intact distal pulses.   Pulmonary/Chest: Effort normal. No respiratory distress.  Abdominal: Soft.  Musculoskeletal: Normal range of motion. He exhibits tenderness.       Left ankle: He exhibits swelling (mild). He exhibits no ecchymosis and no deformity. tenderness. Achilles tendon exhibits pain (mild).       Hands: Neurological: He is alert and oriented to person, place, and time.  Skin: Skin is warm and dry.  Psychiatric: He has a normal mood and affect. His behavior is normal.    ED Course  Procedures (including critical care time)  Labs Reviewed - No data to display Dg Ankle Complete Left  11/22/2012  *RADIOLOGY REPORT*  Clinical Data: Dog bite.  Ankle pain.  LEFT ANKLE COMPLETE - 3+ VIEW  Comparison: None.  Findings: Mild soft tissue swelling about the ankle.  No bony abnormality.  No fracture, subluxation or dislocation.  Joint spaces are maintained.  No radiopaque foreign body or soft tissue gas.  IMPRESSION: No acute bony abnormality.   Original Report Authenticated By: Charlett Nose, M.D.      No diagnosis found.  Ankle pain, posterior, after fall and striking back of leg on unknown object.  Splint, crutches, pain medications. Healing wound from dog bite on hand, no indication of infection.  MDM    I personally performed the services described in this documentation, which was scribed in my presence. The recorded information has been reviewed and is  accurate.       Jimmye Norman, NP 11/23/12 [phone]

## 2012-11-22 NOTE — ED Notes (Signed)
Pt has wound to right hand with healing noted; pt denying any numbness or tingling to extremity; states "it just hurts"

## 2012-11-22 NOTE — Progress Notes (Signed)
Orthopedic Tech Progress Note Patient Details:  [name] 1986/03/23 [id number]  Ortho Devices Type of Ortho Device: Crutches;Ankle Air splint Ortho Device/Splint Location: (L) LE Ortho Device/Splint Interventions: Application   Jennye Moccasin 11/22/2012, 6:41 PM

## 2012-11-22 NOTE — ED Notes (Signed)
Pt was bitten by a unknown pit bull in [place] 4 days ago.  Pt has swelling to right finger since bite and also reports swelling to left ankle with pain in ankle.  Pt denies any trauma to left ankle.

## 2012-11-25 NOTE — ED Provider Notes (Signed)
Medical screening examination/treatment/procedure(s) were performed by non-physician practitioner and as supervising physician I was immediately available for consultation/collaboration.   Cataleah Stites Y. Demontray Franta, MD 11/25/12 0829 

## 2013-05-18 ENCOUNTER — Encounter (HOSPITAL_COMMUNITY): Payer: Self-pay | Admitting: Emergency Medicine

## 2013-05-18 ENCOUNTER — Emergency Department (HOSPITAL_COMMUNITY)
Admission: EM | Admit: 2013-05-18 | Discharge: 2013-05-18 | Disposition: A | Discharge status: Home / Self Care | Payer: No Typology Code available for payment source | Attending: Emergency Medicine | Admitting: Emergency Medicine

## 2013-05-18 ENCOUNTER — Emergency Department (HOSPITAL_COMMUNITY): Payer: No Typology Code available for payment source

## 2013-05-18 DIAGNOSIS — Z8659 Personal history of other mental and behavioral disorders: Secondary | ICD-10-CM | POA: Insufficient documentation

## 2013-05-18 DIAGNOSIS — F17200 Nicotine dependence, unspecified, uncomplicated: Secondary | ICD-10-CM | POA: Insufficient documentation

## 2013-05-18 DIAGNOSIS — S4980XA Other specified injuries of shoulder and upper arm, unspecified arm, initial encounter: Secondary | ICD-10-CM | POA: Insufficient documentation

## 2013-05-18 DIAGNOSIS — S46909A Unspecified injury of unspecified muscle, fascia and tendon at shoulder and upper arm level, unspecified arm, initial encounter: Secondary | ICD-10-CM | POA: Insufficient documentation

## 2013-05-18 DIAGNOSIS — Z8719 Personal history of other diseases of the digestive system: Secondary | ICD-10-CM | POA: Insufficient documentation

## 2013-05-18 DIAGNOSIS — Y9389 Activity, other specified: Secondary | ICD-10-CM | POA: Insufficient documentation

## 2013-05-18 DIAGNOSIS — M25512 Pain in left shoulder: Secondary | ICD-10-CM

## 2013-05-18 DIAGNOSIS — Y92410 Unspecified street and highway as the place of occurrence of the external cause: Secondary | ICD-10-CM | POA: Insufficient documentation
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
## 2013-05-18 NOTE — ED Notes (Signed)
Patient refuses to have discharge vitals taken.
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
## 2014-03-16 ENCOUNTER — Encounter (HOSPITAL_COMMUNITY): Payer: Self-pay | Admitting: Emergency Medicine

## 2014-03-16 ENCOUNTER — Emergency Department (HOSPITAL_COMMUNITY)
Admission: EM | Admit: 2014-03-16 | Discharge: 2014-03-16 | Disposition: A | Discharge status: Home / Self Care | Payer: Self-pay | Attending: Emergency Medicine | Admitting: Emergency Medicine

## 2014-03-16 DIAGNOSIS — Z8659 Personal history of other mental and behavioral disorders: Secondary | ICD-10-CM | POA: Insufficient documentation

## 2014-03-16 DIAGNOSIS — H6590 Unspecified nonsuppurative otitis media, unspecified ear: Secondary | ICD-10-CM | POA: Insufficient documentation

## 2014-03-16 DIAGNOSIS — H6692 Otitis media, unspecified, left ear: Secondary | ICD-10-CM

## 2014-03-16 DIAGNOSIS — R000 Tachycardia, unspecified: Secondary | ICD-10-CM | POA: Insufficient documentation

## 2014-03-16 DIAGNOSIS — R42 Dizziness and giddiness: Secondary | ICD-10-CM | POA: Insufficient documentation

## 2014-03-16 DIAGNOSIS — Z8719 Personal history of other diseases of the digestive system: Secondary | ICD-10-CM | POA: Insufficient documentation

## 2014-03-16 DIAGNOSIS — F17200 Nicotine dependence, unspecified, uncomplicated: Secondary | ICD-10-CM | POA: Insufficient documentation

## 2014-03-16 DIAGNOSIS — M5430 Sciatica, unspecified side: Secondary | ICD-10-CM | POA: Insufficient documentation

## 2014-03-16 MED ORDER — HYDROMORPHONE HCL PF 1 MG/ML IJ SOLN
0.5000 mg | Freq: Once | INTRAMUSCULAR | Status: AC
  Administered 2014-03-16: 0.5 mg via INTRAMUSCULAR
  Filled 2014-03-16: qty 1

## 2014-03-16 MED ORDER — AMOXICILLIN 500 MG PO CAPS: 500.0000 mg | ORAL_CAPSULE | Freq: Three times a day (TID) | ORAL | Status: DC

## 2014-03-16 MED ORDER — OXYCODONE-ACETAMINOPHEN 5-325 MG PO TABS: 1.0000 | ORAL_TABLET | Freq: Four times a day (QID) | ORAL | Status: DC | PRN

## 2014-03-16 MED ORDER — KETOROLAC TROMETHAMINE 60 MG/2ML IM SOLN
60.0000 mg | Freq: Once | INTRAMUSCULAR | Status: AC
  Administered 2014-03-16: 60 mg via INTRAMUSCULAR
  Filled 2014-03-16: qty 2

## 2014-03-16 NOTE — ED Provider Notes (Signed)
CSN: [id number]     Arrival date & time 03/16/14  1629 History   First MD Initiated Contact with Patient 03/16/14 1705     Chief Complaint  Patient presents with  . Otalgia  . Dizziness     (Consider location/radiation/quality/duration/timing/severity/associated sxs/prior Treatment) Patient is a 28 y.o. male presenting with ear pain and dizziness. The history is provided by the patient.  Otalgia Location:  Left Associated symptoms: no abdominal pain, no diarrhea, no headaches, no rash and no vomiting   Dizziness Associated symptoms: no chest pain, no diarrhea, no headaches, no nausea, no shortness of breath and no vomiting    patient's had pain in his left ear for the last 2 days. He states that there is been some drainage. He states he's had some episodes of dizziness/lightheadedness. No fevers. No numbness or weakness. No confusion. He also states he's had pain in his back going down the back of his right leg. No numbness or weakness. Worse with movements. He has had some pain in the back before but usually does not go down the leg. He states he did have tripped the other day but was not hurting at that time. No dysuria.  Past Medical History  Diagnosis Date  . Depression   . GERD (gastroesophageal reflux disease)    Past Surgical History  Procedure Laterality Date  . Thumb surgery    . Abdominal surgery     History reviewed. No pertinent family history. History  Substance Use Topics  . Smoking status: Current Every Day Smoker -- 0.50 packs/day for 6 years    Types: Cigarettes  . Smokeless tobacco: Never Used  . Alcohol Use: No    Review of Systems  Constitutional: Negative for activity change and appetite change.  HENT: Positive for ear pain.   Eyes: Negative for pain.  Respiratory: Negative for chest tightness and shortness of breath.   Cardiovascular: Negative for chest pain and leg swelling.  Gastrointestinal: Negative for nausea, vomiting, abdominal pain and  diarrhea.  Genitourinary: Negative for flank pain.  Musculoskeletal: Positive for back pain and gait problem. Negative for neck stiffness.  Skin: Negative for rash.  Neurological: Positive for dizziness and light-headedness. Negative for weakness, numbness and headaches.  Psychiatric/Behavioral: Negative for behavioral problems.      Allergies  Tylenol; Ibuprofen; and Ultram  Home Medications   Prior to Admission medications   Not on File   BP 115/69  Pulse 71  Temp(Src) 98.4 F (36.9 C) (Oral)  Resp 15  SpO2 98% Physical Exam  Constitutional: He appears well-developed.  Patient appears uncomfortable  HENT:  Head: Normocephalic.  Purulent drainage with purulent effusion and left canal  Eyes: Pupils are equal, round, and reactive to light.  Neck: Neck supple.  Cardiovascular:  Tachycardia  Pulmonary/Chest: Effort normal and breath sounds normal.  Abdominal: Soft. Bowel sounds are normal.  Musculoskeletal: He exhibits tenderness.  Tenderness in right SI area. Some pain with straight leg raise on right. Mild tenderness to posterior right upper leg. Neurovascular intact over right foot. Flexion extension intact at ankle. Dorsalis pedis pulse intact  Neurological: He is alert.    ED Course  Procedures (including critical care time) Labs Review Labs Reviewed - No data to display  Imaging Review No results found.   EKG Interpretation   Date/Time:  Sunday Mar 16 2014 16:39:10 EDT Ventricular Rate:  110 PR Interval:  126 QRS Duration: 90 QT Interval:  308 QTC Calculation: 416 R Axis:   92 Text  Interpretation:  Sinus tachycardia Rightward axis Abnormal ECG  Confirmed by Rubin Payor  MD, Harrold Donath [phone]) on 03/16/2014 5:29:24 PM      MDM   Final diagnoses:  Otitis media of left ear  Sciatica    Patient with right\like pain. Likely sciatica. Also has left draining ear infection. 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 Will  give antibiotics and pain meds and discharged home    Juliet Rude. Rubin Payor, MD 03/17/14 [phone]

## 2014-03-16 NOTE — Discharge Instructions (Signed)
Otitis Media, Adult Otitis media is redness, soreness, and swelling (inflammation) of the middle ear. Otitis media may be caused by allergies or, most commonly, by infection. Often it occurs as a complication of the common cold. SIGNS AND SYMPTOMS Symptoms of otitis media may include:  Earache.  Fever.  Ringing in your ear.  Headache.  Leakage of fluid from the ear. DIAGNOSIS To diagnose otitis media, your health care provider will examine your ear with an otoscope. This is an instrument that allows your health care provider to see into your ear in order to examine your eardrum. Your health care provider also will ask you questions about your symptoms. TREATMENT  Typically, otitis media resolves on its own within 3 5 days. Your health care provider may prescribe medicine to ease your symptoms of pain. If otitis media does not resolve within 5 days or is recurrent, your health care provider may prescribe antibiotic medicines if he or she suspects that a bacterial infection is the cause. HOME CARE INSTRUCTIONS   Take your medicine as directed until it is gone, even if you feel better after the first few days.  Only take over-the-counter or prescription medicines for pain, discomfort, or fever as directed by your health care provider.  Follow up with your health care provider as directed. SEEK MEDICAL CARE IF:  You have otitis media only in one ear or bleeding from your nose or both.  You notice a lump on your neck.  You are not getting better in 3 5 days.  You feel worse instead of better. SEEK IMMEDIATE MEDICAL CARE IF:   You have pain that is not controlled with medicine.  You have swelling, redness, or pain around your ear or stiffness in your neck.  You notice that part of your face is paralyzed.  You notice that the bone behind your ear (mastoid) is tender when you touch it. MAKE SURE YOU:   Understand these instructions.  Will watch your condition.  Will get help  right away if you are not doing well or get worse. Document Released: 07/15/2004 Document Revised: 07/31/2013 Document Reviewed: 05/07/2013 [facility] Patient Information 2014 [place], [place].  Sciatica Sciatica is pain, weakness, numbness, or tingling along the path of the sciatic nerve. The nerve starts in the lower back and runs down the back of each leg. The nerve controls the muscles in the lower leg and in the back of the knee, while also providing sensation to the back of the thigh, lower leg, and the sole of your foot. Sciatica is a symptom of another medical condition. For instance, nerve damage or certain conditions, such as a herniated disk or bone spur on the spine, pinch or put pressure on the sciatic nerve. This causes the pain, weakness, or other sensations normally associated with sciatica. Generally, sciatica only affects one side of the body. CAUSES   Herniated or slipped disc.  Degenerative disk disease.  A pain disorder involving the narrow muscle in the buttocks (piriformis syndrome).  Pelvic injury or fracture.  Pregnancy.  Tumor (rare). SYMPTOMS  Symptoms can vary from mild to very severe. The symptoms usually travel from the low back to the buttocks and down the back of the leg. Symptoms can include:  Mild tingling or dull aches in the lower back, leg, or hip.  Numbness in the back of the calf or sole of the foot.  Burning sensations in the lower back, leg, or hip.  Sharp pains in the lower back, leg, or  hip.  Leg weakness.  Severe back pain inhibiting movement. These symptoms may get worse with coughing, sneezing, laughing, or prolonged sitting or standing. Also, being overweight may worsen symptoms. DIAGNOSIS  Your caregiver will perform a physical exam to look for common symptoms of sciatica. He or she may ask you to do certain movements or activities that would trigger sciatic nerve pain. Other tests may be performed to find the cause of the sciatica.  These may include:  Blood tests.  X-rays.  Imaging tests, such as an MRI or CT scan. TREATMENT  Treatment is directed at the cause of the sciatic pain. Sometimes, treatment is not necessary and the pain and discomfort goes away on its own. If treatment is needed, your caregiver may suggest:  Over-the-counter medicines to relieve pain.  Prescription medicines, such as anti-inflammatory medicine, muscle relaxants, or narcotics.  Applying heat or ice to the painful area.  Steroid injections to lessen pain, irritation, and inflammation around the nerve.  Reducing activity during periods of pain.  Exercising and stretching to strengthen your abdomen and improve flexibility of your spine. Your caregiver may suggest losing weight if the extra weight makes the back pain worse.  Physical therapy.  Surgery to eliminate what is pressing or pinching the nerve, such as a bone spur or part of a herniated disk. HOME CARE INSTRUCTIONS   Only take over-the-counter or prescription medicines for pain or discomfort as directed by your caregiver.  Apply ice to the affected area for 20 minutes, 3 4 times a day for the first 48 72 hours. Then try heat in the same way.  Exercise, stretch, or perform your usual activities if these do not aggravate your pain.  Attend physical therapy sessions as directed by your caregiver.  Keep all follow-up appointments as directed by your caregiver.  Do not wear high heels or shoes that do not provide proper support.  Check your mattress to see if it is too soft. A firm mattress may lessen your pain and discomfort. SEEK IMMEDIATE MEDICAL CARE IF:   You lose control of your bowel or bladder (incontinence).  You have increasing weakness in the lower back, pelvis, buttocks, or legs.  You have redness or swelling of your back.  You have a burning sensation when you urinate.  You have pain that gets worse when you lie down or awakens you at night.  Your pain  is worse than you have experienced in the past.  Your pain is lasting longer than 4 weeks.  You are suddenly losing weight without reason. MAKE SURE YOU:  Understand these instructions.  Will watch your condition.  Will get help right away if you are not doing well or get worse. Document Released: 10/04/2001 Document Revised: 04/10/2012 Document Reviewed: 02/19/2012 [facility] Patient Information 2014 [place], [place].

## 2014-03-16 NOTE — ED Notes (Signed)
Pt states that he has had ear pain and drainage on the left side. Pt states that he has had dizziness for 2 days. Pt states that it occurs constantly. Pt has rt hip pain as well.

## 2014-03-17 ENCOUNTER — Emergency Department (HOSPITAL_COMMUNITY)
Admission: EM | Admit: 2014-03-17 | Discharge: 2014-03-17 | Disposition: A | Discharge status: Home / Self Care | Payer: Self-pay | Attending: Emergency Medicine | Admitting: Emergency Medicine

## 2014-03-17 ENCOUNTER — Encounter (HOSPITAL_COMMUNITY): Payer: Self-pay | Admitting: Emergency Medicine

## 2014-03-17 DIAGNOSIS — Z79899 Other long term (current) drug therapy: Secondary | ICD-10-CM | POA: Insufficient documentation

## 2014-03-17 DIAGNOSIS — Z8719 Personal history of other diseases of the digestive system: Secondary | ICD-10-CM | POA: Insufficient documentation

## 2014-03-17 DIAGNOSIS — M533 Sacrococcygeal disorders, not elsewhere classified: Secondary | ICD-10-CM

## 2014-03-17 DIAGNOSIS — Z8659 Personal history of other mental and behavioral disorders: Secondary | ICD-10-CM | POA: Insufficient documentation

## 2014-03-17 DIAGNOSIS — F17200 Nicotine dependence, unspecified, uncomplicated: Secondary | ICD-10-CM | POA: Insufficient documentation

## 2014-03-17 NOTE — ED Notes (Signed)
He was here last night for R hip pain. He states the pain is worse and he is upset because he did not have an xray. He was given a Rx for pain medication which he did not get filled

## 2014-03-17 NOTE — ED Provider Notes (Signed)
CSN: [id number]     Arrival date & time 03/17/14  [id number] History   None   This chart was scribed for Bernard CowmanHeather Tilak Oakley PA-C, a non-physician practitioner working with Bernard ChickMartha K Linker, MD by Bernard RifeAlexandra Hamilton, ED Scribe. This patient was seen in room TR08C/TR08C and the patient's care was started at 10:55 AM      Chief Complaint  Patient presents with  . Hip Pain     (Consider location/radiation/quality/duration/timing/severity/associated sxs/prior Treatment) The history is provided by the patient. No language interpreter was used.   HPI Comments: Bernard PrinceJustin L Hamilton is a 28 y.o. male who presents to the Emergency Department complaining of constant right hip pain onset 2 days. Pt was evaluated for the same this morning in ED. States he rides a dirt bike. Describes pain as gradually worsening in severity. States he cannot afford his prescriptions for pain medication. Denies trying any alleviating factors. Reports pain exacerbated by standing and by touch. Denies associated recent injury, and fall/trauma. Denies urinary or fecal incontinence, urinary retention, perineal/saddle paresthesias, fever, and PMHx of cancer. 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     Past Medical History  Diagnosis Date  . Depression   . GERD (gastroesophageal reflux disease)    Past Surgical History  Procedure Laterality Date  . Thumb surgery    . Abdominal surgery     No family history on file. History  Substance Use Topics  . Smoking status: Current Every Day Smoker -- 0.50 packs/day for 6 years    Types: Cigarettes  . Smokeless tobacco: Never Used  . Alcohol Use: No    Review of Systems  Constitutional: Negative for fever.  Genitourinary: Negative.   Musculoskeletal: Positive for back pain.  Skin: Negative for color change and rash.      Allergies  Tylenol; Ibuprofen; and Ultram  Home Medications   Prior to Admission medications   Medication Sig Start Date End Date Taking?  Authorizing Provider  amoxicillin (AMOXIL) 500 MG capsule Take 1 capsule (500 mg total) by mouth 3 (three) times daily. 03/16/14   [name], MD  oxyCODONE-acetaminophen (PERCOCET/ROXICET) 5-325 MG per tablet Take 1-2 tablets by mouth every 6 (six) hours as needed for severe pain. 03/16/14   [name], MD   There were no vitals taken for this visit. Physical Exam  Nursing note and vitals reviewed. Constitutional: He is oriented to person, place, and time. He appears well-developed and well-nourished. No distress.  Patient appears uncomfortable  HENT:  Head: Normocephalic and atraumatic.  Eyes: Conjunctivae and EOM are normal.  Neck: Neck supple.  Cardiovascular: Normal rate.   Pulmonary/Chest: Effort normal and breath sounds normal. No respiratory distress.  Abdominal: Soft. Bowel sounds are normal.  Musculoskeletal: Normal range of motion. He exhibits tenderness.       Right hip: Normal. He exhibits normal range of motion, no crepitus and no deformity.  Tenderness in right SI area. Some pain with straight leg raise on right. Mild tenderness to posterior right upper leg. Neurovascular intact over right foot. Flexion extension intact at ankle. Dorsalis pedis pulse intact  Neurological: He is alert and oriented to person, place, and time.  Skin: Skin is warm and dry. No erythema.  No erythema, edema, or no warmth of the right hip or lumbar spine   Psychiatric: He has a normal mood and affect. His behavior is normal.    ED Course  Procedures (including critical care time) COORDINATION OF CARE:  Nursing notes reviewed. Vital signs reviewed. Initial pt interview and examination performed.   Filed Vitals:   03/17/14 0946  BP: 118/79  Pulse: 98  Temp: 98.6 F (37 C)  TempSrc: Oral  Resp: 16  Height: 5\' 9"  (1.753 m)  Weight: 158 lb (71.668 kg)  SpO2: 100%    11:00 AM-Discussed work up plan with pt at bedside, which includes No orders of the defined types were  placed in this encounter.  . Pt agrees with plan.  11:17 AM Offered an x-ray of the right hip, but pt declined.  Treatment plan initiated:Medications - No data to display   Initial diagnostic testing ordered.       Labs Review Labs Reviewed - No data to display  Imaging Review No results found.   EKG Interpretation None      MDM   Final diagnoses:  None   Patient presenting with pain of the SI joint.  Pain radiating down right leg.  He was seen here earlier this morning and diagnosed with Sciatica.  He was given a Rx for Oxycodone, which he did not get filled.  He states that he was unable to afford the medication.  No injury.  No signs of infection.  Feel that the patient is stable for discharge.  Return precautions given.  I personally performed the services described in this documentation, which was scribed in my presence. The recorded information has been reviewed and is accurate.    Bernard Glad, PA-C 03/17/14 1640

## 2014-03-17 NOTE — Discharge Instructions (Signed)
Followup with orthopedics if symptoms continue. Use conservative methods at home including heat therapy and cold therapy as we discussed. More information on cold therapy is listed below.  It is not reccommended to use heat treatment directly after an acute injury. ° °SEEK IMMEDIATE MEDICAL ATTENTION IF: °New numbness, tingling, weakness, or problem with the use of your arms or legs.  °Severe back pain not relieved with medications.  °Change in bowel or bladder control.  °Increasing pain in any areas of the body (such as chest or abdominal pain).  °Shortness of breath, dizziness or fainting.  °Nausea (feeling sick to your stomach), vomiting, fever, or sweats. ° °COLD THERAPY DIRECTIONS:  °Ice or gel packs can be used to reduce both pain and swelling. Ice is the most helpful within the first 24 to 48 hours after an injury or flareup from overusing a muscle or joint.  Ice is effective, has very few side effects, and is safe for most people to use.  ° °If you expose your skin to cold temperatures for too long or without the proper protection, you can damage your skin or nerves. Watch for signs of skin damage due to cold.  ° °HOME CARE INSTRUCTIONS  °Follow these tips to use ice and cold packs safely.  °Place a dry or damp towel between the ice and skin. A damp towel will cool the skin more quickly, so you may need to shorten the time that the ice is used.  °For a more rapid response, add gentle compression to the ice.  °Ice for no more than 10 to 20 minutes at a time. The bonier the area you are icing, the less time it will take to get the benefits of ice.  °Check your skin after 5 minutes to make sure there are no signs of a poor response to cold or skin damage.  °Rest 20 minutes or more in between uses.  °Once your skin is numb, you can end your treatment. You can test numbness by very lightly touching your skin. The touch should be so light that you do not see the skin dimple from the pressure of your fingertip.  When using ice, most people will feel these normal sensations in this order: cold, burning, aching, and numbness.  °Do not use ice on someone who cannot communicate their responses to pain, such as small children or people with dementia.  ° °HOW TO MAKE AN ICE PACK  °To make an ice pack, do one of the following:  °Place crushed ice or a bag of frozen vegetables in a sealable plastic bag. Squeeze out the excess air. Place this bag inside another plastic bag. Slide the bag into a pillowcase or place a damp towel between your skin and the bag.  °Mix 3 parts water with 1 part rubbing alcohol. Freeze the mixture in a sealable plastic bag. When you remove the mixture from the freezer, it will be slushy. Squeeze out the excess air. Place this bag inside another plastic bag. Slide the bag into a pillowcase or place a damp towel between your skin and the bag.  ° °SEEK MEDICAL CARE IF:  °You develop white spots on your skin. This may give the skin a blotchy (mottled) appearance.  °Your skin turns blue or pale.  °Your skin becomes waxy or hard.  °Your swelling gets worse.  °MAKE SURE YOU:  °Understand these instructions.  °Will watch your condition.  °Will get help right away if you are not doing well or   get worse.    Chronic Pain Discharge Instructions  Emergency care providers appreciate that many patients coming to [place] are in severe pain and we wish to address their pain in the safest, most responsible manner.  It is important to recognize however, that the proper treatment of chronic pain differs from that of the pain of injuries and acute illnesses.  Our goal is to provide quality, safe, personalized care and we thank you for giving [place] the opportunity to serve you. The use of narcotics and related agents for chronic pain syndromes may lead to additional physical and psychological problems.  Nearly as many people die from prescription narcotics each year as die from car crashes.  Additionally, this risk is increased if  such prescriptions are obtained from a variety of sources.  Therefore, only your primary care physician or a pain management specialist is able to safely treat such syndromes with narcotic medications long-term.    Documentation revealing such prescriptions have been sought from multiple sources may prohibit [place] from providing a refill or different narcotic medication.  Your name may be checked first through the [facility] Controlled Substances Reporting System.  This database is a record of controlled substance medication prescriptions that the patient has received.  This has been established by [facility] in an effort to eliminate the dangerous, and often life threatening, practice of obtaining multiple prescriptions from different medical providers.   If you have a chronic pain syndrome (i.e. chronic headaches, recurrent back or neck pain, dental pain, abdominal or pelvis pain without a specific diagnosis, or neuropathic pain such as fibromyalgia) or recurrent visits for the same condition without an acute diagnosis, you may be treated with non-narcotics and other non-addictive medicines.  Allergic reactions or negative side effects that may be reported by a patient to such medications will not typically lead to the use of a narcotic analgesic or other controlled substance as an alternative.   Patients managing chronic pain with a personal physician should have provisions in place for breakthrough pain.  If you are in crisis, you should call your physician.  If your physician directs you to the emergency department, please have the doctor call and speak to our attending physician concerning your care.   When patients come to the Emergency Department (ED) with acute medical conditions in which the Emergency Department physician feels appropriate to prescribe narcotic or sedating pain medication, the physician will prescribe these in very limited quantities.  The amount of these medications will last  only until you can see your primary care physician in his/her office.  Any patient who returns to the ED seeking refills should expect only non-narcotic pain medications.   In the event of an acute medical condition exists and the emergency physician feels it is necessary that the patient be given a narcotic or sedating medication -  a responsible adult driver should be present in the room prior to the medication being given by the nurse.   Prescriptions for narcotic or sedating medications that have been lost, stolen or expired will not be refilled in the Emergency Department.    Patients who have chronic pain may receive non-narcotic prescriptions until seen by their primary care physician.  It is every patients personal responsibility to maintain active prescriptions with his or her primary care physician or specialist.   Emergency Department Resource Guide 1) Find a Doctor and Pay Out of Pocket Although you won't have to find out who is covered by your insurance plan,  it is a good idea to ask around and get recommendations. You will then need to call the office and see if the doctor you have chosen will accept you as a new patient and what types of options they offer for patients who are self-pay. Some doctors offer discounts or will set up payment plans for their patients who do not have insurance, but you will need to ask so you aren't surprised when you get to your appointment.  2) Contact Your Local Health Department Not all health departments have doctors that can see patients for sick visits, but many do, so it is worth a call to see if yours does. If you don't know where your local health department is, you can check in your phone book. The CDC also has a tool to help you locate your state's health department, and many state websites also have listings of all of their local health departments.  3) Find a Walk-in Clinic If your illness is not likely to be very severe or complicated, you  may want to try a walk in clinic. These are popping up all over the country in pharmacies, drugstores, and shopping centers. They're usually staffed by nurse practitioners or physician assistants that have been trained to treat common illnesses and complaints. They're usually fairly quick and inexpensive. However, if you have serious medical issues or chronic medical problems, these are probably not your best option.  No Primary Care Doctor: - Call Health Connect at  [phone] - they can help you locate a primary care doctor that  accepts your insurance, provides certain services, etc. - Physician Referral Service- [phone]  Chronic Pain Problems: Organization         Address  Phone   Notes  [facility]  [phone] Patients need to be referred by their primary care doctor.   Medication Assistance: Organization         Address  Phone   Notes  [facility] Med[facility] [place] [facility] [address] [place] [place], [place] [address] [phone] --Must be a resident of [facility] -- Must have NO insurance coverage whatsoever (no Medicaid/ Medicare, etc.) -- The pt. MUST have a primary care doctor that directs their care regularly and follows them in the community   [facility]  [phone]   [employer]  [phone]    Agencies that provide inexpensive medical care: Organization         Address  Phone   Notes  [facility]  [phone]   [facility]    [phone]   [facility] [address] [place], [place] [address] [phone]   [facility] [address] [place] [place] [phone]   Planned Parenthood    [phone]   [facility]    [phone]   [facility]  [address], [place] Phone:  [phone], Fax:  [phone] Hours of Operation:  9 am - 6 pm, M-F.  Also accepts Medicaid/Medicare and  self-pay.  [facility]  [address], [place] Phone: [phone], Fax: [phone]. Hours of Operation:  8:30 am - 5:30 pm, M-F.  Also accepts Medicaid and self-pay.  [facility] [place] [address], [place] Phone: [phone]   [facility] [address] Natasha Bence [place], [place] [phone], Ext. [phone] Mondays & Thursdays: 7-9  AM.  First 15 patients are seen on a first come, first serve basis.    Medicaid-accepting [facility] Providers:  Organization         Address  Phone   Notes  [facility] [address], [place] [phone] Also accepts self-pay patients.  [facility] [address] Laurell Josephs [place], [place]  [phone]   [facility] [address], [place] [phone]   [facility] [address], [place] [phone]   Renaye Rakers [address], [place]   [phone] Only accepts [place] Access [place] patients after they have their name applied to their card.   Self-Pay (no insurance) in [facility]:  Organization         Address  Phone   Notes  Sickle Cell Patients, [facility] [address] [place], [place] [phone]   [facility] [address] [place], [place] [phone]   [facility] [place]  [address], [place] [phone]   [facility]/Dr. Osei-Bonsu  [address] [place] or [address], [place] [phone] Phone number for both [place] and [place] locations is the same.  [facility] [address], [place] [phone]   [facility] [address] [place] or [address] [phone]   [facility] [address], [place] [phone], phone;  [phone], fax Sees patients 1st and 3rd Saturday of every month.  Must not qualify for public or private insurance (i.e. Medicaid, Medicare, [place] Health Choice, Veterans' Benefits)  Household income should be no more than 200% of the poverty level The clinic cannot treat you if you are pregnant or think you are pregnant  Sexually transmitted diseases are not treated at the clinic.    Dental Care: Organization         Address  Phone  Notes  [facility] Department of [facility] [address] [place], [place] [phone] Accepts children up to age 31 who are enrolled in [place] or [place] Health Choice; pregnant women with a Medicaid card; and children who have applied for Medicaid or [place] Health Choice, but were declined, whose parents can pay a reduced fee at time of service.  [facility] Department of [facility]  [address], [place] [phone] Accepts children up to age 49 who are enrolled in [place] or [place] Health Choice; pregnant women with a Medicaid card; and children who have applied for Medicaid or [place] Health Choice, but were declined, whose parents can pay a reduced fee at time of service.  [facility]  [address] [place], [place] [phone] Patients are seen by appointment only. Walk-ins are not accepted. [facility] will see patients 65 years of age and older. Monday - Tuesday (8am-5pm) Most Wednesdays (8:30-5pm) $30 per visit, cash only  [facility] Adult Dental Access [facility]  [address], [facility] [phone] Patients are seen by appointment only. Walk-ins are not accepted. [facility] will see patients 40 years of age and older. One Wednesday Evening (Monthly: Volunteer Based).  $30 per visit, cash only  [employer]  [phone] for adults; Children under age 83, call  [facility] at [phone]. Children aged 60-14, please call  [phone] to request a pediatric application.  Dental services are provided in all areas of dental care including fillings, crowns and bridges, complete and partial dentures, implants, gum treatment, root canals, and extractions. Preventive care is also provided. Treatment is provided to both adults and children. Patients are selected via a lottery and there is often a waiting list.   [facility] [address] [place]  [phone] [email or web address]   [facility] [address] [place], [place] [phone], Ext. [phone] Second and Fourth Thursday of each month, opens at 6:30 AM; Clinic ends at 9 AM.  Patients are seen on a first-come first-served basis, and a limited number are seen during each clinic.   [facility]  [address] Ether Griffins [place], [place] [phone]   Eligibility Requirements You must have lived in [place], [place], or [place] counties for at least the last three months.   You cannot be eligible for state or federal sponsored [place], including [employer], [place], or [employer].   You generally cannot be eligible for healthcare insurance through your employer.    How to apply: Eligibility screenings are held every Tuesday and Wednesday afternoon from 1:00 pm until 4:00 pm. You do not need an appointment for the interview!  [facility] [address], [place], [place] [phone]   [facility]  [phone]   [facility]  [phone]   [facility]  [phone]    Behavioral Health Resources in the Community: Intensive Outpatient Programs Organization         Address  Phone  Notes  [facility] [address], [place], [place] [phone]   [facility] [address] [place], [place] [phone]   [facility] [address] [place], [place]   [phone]   [facility] Mental Health [address]  [place], [place] [phone] or [phone]   Substance Abuse Resources Organization         Address  Phone  Notes  [facility]  [phone]   [facility]  [phone]   [facility]  [phone]   Floydene Flock  [phone]   Residential & Outpatient Substance Abuse Program  [phone]   Psychological Services Organization         Address  Phone  Notes  [facility]  [phone]   [facility] Services  [phone]   [facility] Mental Health [address] [place] [phone] or [phone]    Mobile Crisis Teams Organization         Address  Phone  Notes  [facility], Mobile Crisis Care Unit  [phone]   [facility]  [address] [place], [place] [phone]   Doristine Locks [address] [place] [phone]    Self-Help/Support Groups Organization         Address  Phone             Notes  [facility] [place] - variety of support groups  336- I7437963 Call for more information  Narcotics Anonymous (NA), [facility] [address], [employer] [place]  2 meetings at this location   Statistician         Address  Phone  Notes  [facility] [address],  [place]  [phone]   [facility]  [address] [place] [phone], [place], [place] [phone]   [facility] [address], [place] [phone] Admissions: 8am-3pm M-F  [facility] [address]    [place], [place] [phone]   [facility] [address], [place], [place] [phone]   [facility] [address]  [place], [place] [phone]   [facility] - Intensive Outpatient [address], [place], [place] [phone]   [facility] ([facility]iction Recovery  [facility] [address]  [place], [place] [phone] or [phone]   [facility] ([facility]) [address] [place], [place] [phone] Accepts Medicaid  [facility] [address]  [place] [phone] Substance Abuse/Addiction Treatment   [facility] Behavioral Health Resources Organization         Address  Phone  Notes  [facility]  [phone]   Angie FavaJulie Brannon, PhD [address] [place], [place]   [phone]) [phone] or [phone]) [phone]   [facility]   [address] [place], [place] [phone](336) [phone]   [facility] [address], [place], [place] [phone]) [phone] Insurance/Medicaid/sponsorship through [facility]  [facility] [address]                                    [place], [place] [phone] Therapy/tele-psych/case  [facility] [address]   [place], [place] [phone](336) [phone]    Dr. Lolly MustacheArfeen  [phone]   [facility]  [employer] [facility] 1) [address],  2) [address], [place] 3)  [address], [place] [phone]  [phone]) [phone]   [facility] Child Abuse Hotline [phone]) [phone] or [phone]) [phone] (After Hours)

## 2014-03-20 NOTE — ED Provider Notes (Signed)
Medical screening examination/treatment/procedure(s) were performed by non-physician practitioner and as supervising physician I was immediately available for consultation/collaboration.   EKG Interpretation None       Sheylin Scharnhorst K Linker, MD 03/20/14 1615 

## 2014-03-21 ENCOUNTER — Emergency Department (HOSPITAL_COMMUNITY): Payer: Self-pay

## 2014-03-21 ENCOUNTER — Encounter (HOSPITAL_COMMUNITY): Payer: Self-pay | Admitting: Emergency Medicine

## 2014-03-21 ENCOUNTER — Emergency Department (HOSPITAL_COMMUNITY)
Admission: EM | Admit: 2014-03-21 | Discharge: 2014-03-21 | Disposition: A | Discharge status: Other Acute Inpatient Hospital | Payer: Self-pay | Attending: Emergency Medicine | Admitting: Emergency Medicine

## 2014-03-21 ENCOUNTER — Telehealth (HOSPITAL_BASED_OUTPATIENT_CLINIC_OR_DEPARTMENT_OTHER): Payer: Self-pay

## 2014-03-21 DIAGNOSIS — K5900 Constipation, unspecified: Secondary | ICD-10-CM | POA: Insufficient documentation

## 2014-03-21 DIAGNOSIS — Z8719 Personal history of other diseases of the digestive system: Secondary | ICD-10-CM | POA: Insufficient documentation

## 2014-03-21 DIAGNOSIS — R61 Generalized hyperhidrosis: Secondary | ICD-10-CM | POA: Insufficient documentation

## 2014-03-21 DIAGNOSIS — Z8669 Personal history of other diseases of the nervous system and sense organs: Secondary | ICD-10-CM | POA: Insufficient documentation

## 2014-03-21 DIAGNOSIS — Z792 Long term (current) use of antibiotics: Secondary | ICD-10-CM | POA: Insufficient documentation

## 2014-03-21 DIAGNOSIS — M4628 Osteomyelitis of vertebra, sacral and sacrococcygeal region: Secondary | ICD-10-CM

## 2014-03-21 DIAGNOSIS — M869 Osteomyelitis, unspecified: Secondary | ICD-10-CM | POA: Insufficient documentation

## 2014-03-21 DIAGNOSIS — F17200 Nicotine dependence, unspecified, uncomplicated: Secondary | ICD-10-CM | POA: Insufficient documentation

## 2014-03-21 DIAGNOSIS — Z8659 Personal history of other mental and behavioral disorders: Secondary | ICD-10-CM | POA: Insufficient documentation

## 2014-03-21 DIAGNOSIS — R509 Fever, unspecified: Secondary | ICD-10-CM | POA: Insufficient documentation

## 2014-03-21 LAB — COMPREHENSIVE METABOLIC PANEL
ALT: 15 U/L (ref 0–53)
AST: 18 U/L (ref 0–37)
Albumin: 3.3 g/dL — ABNORMAL LOW (ref 3.5–5.2)
Alkaline Phosphatase: 94 U/L (ref 39–117)
BUN: 13 mg/dL (ref 6–23)
CALCIUM: 9.5 mg/dL (ref 8.4–10.5)
CHLORIDE: 94 meq/L — AB (ref 96–112)
CO2: 23 meq/L (ref 19–32)
CREATININE: 0.82 mg/dL (ref 0.50–1.35)
GFR calc Af Amer: >90 mL/min (ref >90)
GFR calc non Af Amer: >90 mL/min (ref >90)
Glucose, Bld: 130 mg/dL — ABNORMAL HIGH (ref 70–99)
Potassium: 3.4 mEq/L — ABNORMAL LOW (ref 3.7–5.3)
Sodium: 130 mEq/L — ABNORMAL LOW (ref 137–147)
Total Bilirubin: 0.8 mg/dL (ref 0.3–1.2)
Total Protein: 7.4 g/dL (ref 6.0–8.3)

## 2014-03-21 LAB — URINALYSIS, ROUTINE W REFLEX MICROSCOPIC
Glucose, UA: NEGATIVE mg/dL
HGB URINE DIPSTICK: NEGATIVE
Ketones, ur: 15 mg/dL — AB
Leukocytes, UA: NEGATIVE
Nitrite: NEGATIVE
PH: 6 (ref 5.0–8.0)
Protein, ur: 30 mg/dL — AB
Specific Gravity, Urine: 1.033 — ABNORMAL HIGH (ref 1.005–1.030)
Urobilinogen, UA: 1 mg/dL (ref 0.0–1.0)

## 2014-03-21 LAB — CBC WITH DIFFERENTIAL/PLATELET
BASOS PCT: 0 % (ref 0–1)
Basophils Absolute: 0 10*3/uL (ref 0.0–0.1)
EOS PCT: 0 % (ref 0–5)
Eosinophils Absolute: 0 10*3/uL (ref 0.0–0.7)
HEMATOCRIT: 41.6 % (ref 39.0–52.0)
Hemoglobin: 14.8 g/dL (ref 13.0–17.0)
LYMPHS PCT: 5 % — AB (ref 12–46)
Lymphs Abs: 0.7 10*3/uL (ref 0.7–4.0)
MCH: 32.1 pg (ref 26.0–34.0)
MCHC: 35.6 g/dL (ref 30.0–36.0)
MCV: 90.2 fL (ref 78.0–100.0)
MONO ABS: 1.2 10*3/uL — AB (ref 0.1–1.0)
Monocytes Relative: 9 % (ref 3–12)
NEUTROS ABS: 12.1 10*3/uL — AB (ref 1.7–7.7)
Neutrophils Relative %: 86 % — ABNORMAL HIGH (ref 43–77)
PLATELETS: 256 10*3/uL (ref 150–400)
RBC: 4.61 MIL/uL (ref 4.22–5.81)
RDW: 12.4 % (ref 11.5–15.5)
WBC: 14 10*3/uL — AB (ref 4.0–10.5)

## 2014-03-21 LAB — SEDIMENTATION RATE: Sed Rate: 56 mm/hr — ABNORMAL HIGH (ref 0–16)

## 2014-03-21 LAB — I-STAT CG4 LACTIC ACID, ED: Lactic Acid, Venous: 1.97 mmol/L (ref 0.5–2.2)

## 2014-03-21 LAB — CK: CK TOTAL: 72 U/L (ref 7–232)

## 2014-03-21 LAB — URINE MICROSCOPIC-ADD ON

## 2014-03-21 MED ORDER — ONDANSETRON HCL 4 MG/2ML IJ SOLN
4.0000 mg | Freq: Once | INTRAMUSCULAR | Status: AC
  Administered 2014-03-21: 4 mg via INTRAVENOUS

## 2014-03-21 MED ORDER — ONDANSETRON HCL 4 MG/2ML IJ SOLN
4.0000 mg | Freq: Once | INTRAMUSCULAR | Status: DC
  Filled 2014-03-21: qty 2

## 2014-03-21 MED ORDER — VANCOMYCIN HCL IN DEXTROSE 1-5 GM/200ML-% IV SOLN
1000.0000 mg | Freq: Once | INTRAVENOUS | Status: AC
  Administered 2014-03-21: 1000 mg via INTRAVENOUS
  Filled 2014-03-21: qty 200

## 2014-03-21 MED ORDER — ONDANSETRON HCL 4 MG/2ML IJ SOLN
4.0000 mg | Freq: Once | INTRAMUSCULAR | Status: AC
  Administered 2014-03-21: 4 mg via INTRAVENOUS
  Filled 2014-03-21: qty 2

## 2014-03-21 MED ORDER — HYDROMORPHONE HCL PF 1 MG/ML IJ SOLN
2.0000 mg | Freq: Once | INTRAMUSCULAR | Status: AC
  Administered 2014-03-21: 2 mg via INTRAVENOUS
  Filled 2014-03-21: qty 2

## 2014-03-21 MED ORDER — HYDROMORPHONE HCL PF 1 MG/ML IJ SOLN
1.0000 mg | Freq: Once | INTRAMUSCULAR | Status: AC
  Administered 2014-03-21: 1 mg via INTRAVENOUS
  Filled 2014-03-21: qty 1

## 2014-03-21 MED ORDER — DEXTROSE 5 % IV SOLN
1.0000 g | Freq: Once | INTRAVENOUS | Status: AC
  Administered 2014-03-21: 1 g via INTRAVENOUS
  Filled 2014-03-21: qty 1

## 2014-03-21 MED ORDER — GADOBENATE DIMEGLUMINE 529 MG/ML IV SOLN
15.0000 mL | Freq: Once | INTRAVENOUS | Status: AC
  Administered 2014-03-21: 15 mL via INTRAVENOUS

## 2014-03-21 MED ORDER — SODIUM CHLORIDE 0.9 % IV BOLUS (SEPSIS)
1000.0000 mL | Freq: Once | INTRAVENOUS | Status: AC
  Administered 2014-03-21: 1000 mL via INTRAVENOUS

## 2014-03-21 NOTE — Telephone Encounter (Signed)
[facility] calling with (+) blood cx result.  Per ED MD note pt was transferred to [facility] informed need to call [facility] with results.

## 2014-03-21 NOTE — ED Notes (Signed)
Pt placed on bedpan

## 2014-03-21 NOTE — ED Provider Notes (Signed)
CSN: [id number]     Arrival date & time 03/21/14  0054 History   First MD Initiated Contact with Patient 03/21/14 0248     Chief Complaint  Patient presents with  . Generalized Body Aches     (Consider location/radiation/quality/duration/timing/severity/associated sxs/prior Treatment) HPI 28 yo male presents to the ER from home with complaint of worsening back pain and inability to walk.  Pt reports onset of right hip pain on Sunday, left ear pain and dizziness, 5 days ago.  He was seen in the ER for same x 2 on that day.  Pt was dx with left otitis media and sciatica.  Pt reports since being seen in the ED the pain has become steadily worse.  He is now unable to move right leg due to severe pain, and pain has spread to left side as well.  Pt has had fevers to 100 at home.  Family reports he has episodes where he turns red and is very sweaty.  He denies any urinary incontinence, but has had constipation for the last 4 days.  Pt has been drinking plenty of fluids, but has had no appetite and has not been eating.  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  He used on Saturday night prior to onset of pain, initially thought he slept wrong.  He has used several times this week to help with his pain control.  No numbness or paralysis, but reports both legs "feel funny and very heavy".   Past Medical History  Diagnosis Date  . Depression   . GERD (gastroesophageal reflux disease)    Past Surgical History  Procedure Laterality Date  . Thumb surgery    . Abdominal surgery     No family history on file. History  Substance Use Topics  . Smoking status: Current Every Day Smoker -- 0.50 packs/day for 6 years    Types: Cigarettes  . Smokeless tobacco: Never Used  . Alcohol Use: No    Review of Systems  See History of Present Illness; otherwise all other systems are reviewed and negative   Allergies  Tylenol; Ibuprofen; and Ultram  Home Medications   Prior to  Admission medications   Medication Sig Start Date End Date Taking? Authorizing Provider  amoxicillin (AMOXIL) 500 MG capsule Take 1 capsule (500 mg total) by mouth 3 (three) times daily. 03/16/14  Yes Juliet Rude. Pickering, MD  ibuprofen (ADVIL,MOTRIN) 200 MG tablet Take 1,400 mg by mouth every 6 (six) hours as needed.   Yes Historical Provider, MD  oxyCODONE-acetaminophen (PERCOCET/ROXICET) 5-325 MG per tablet Take 1-2 tablets by mouth every 6 (six) hours as needed for severe pain. 03/16/14  Yes Juliet Rude. Pickering, MD   BP 119/72  Pulse 91  Temp(Src) 99.7 F (37.6 C) (Oral)  Resp 20  Ht 5\' 9"  (1.753 m)  Wt 158 lb (71.668 kg)  BMI 23.32 kg/m2  SpO2 96% Physical Exam  Nursing note and vitals reviewed. Constitutional: He is oriented to person, place, and time. He appears well-developed and well-nourished. He appears distressed.  Patient is ill appearing, disheveled and uncomfortable  HENT:  Head: Normocephalic and atraumatic.  Nose: Nose normal.  Mouth/Throat: Oropharynx is clear and moist.  Eyes: Conjunctivae and EOM are normal. Pupils are equal, round, and reactive to light.  Neck: Normal range of motion. Neck supple. No JVD present. No tracheal deviation present. No thyromegaly present.  Cardiovascular: Normal rate, regular rhythm, normal heart sounds and intact distal pulses.  Exam reveals  no gallop and no friction rub.   No murmur heard. Pulmonary/Chest: Effort normal and breath sounds normal. No stridor. No respiratory distress. He has no wheezes. He has no rales. He exhibits no tenderness.  Abdominal: Soft. Bowel sounds are normal. He exhibits no distension and no mass. There is no tenderness. There is no rebound and no guarding.  Musculoskeletal: Normal range of motion. He exhibits tenderness. He exhibits no edema.  Patient with severe tenderness to palpation of the right leg.  Right leg seems warmer than the left.  Patient has moderate pain with straight leg rise of the left, severe  excruciating pain with straight leg raise to the right.  Patient has diminished strength secondary to pain  Lymphadenopathy:    He has no cervical adenopathy.  Neurological: He is alert and oriented to person, place, and time. He displays abnormal reflex. No cranial nerve deficit.  Unable to assess strength of lower extremities secondary to severe pain.   Patellar reflexes bilaterally 1+  Skin: Skin is warm and dry. No rash noted. No erythema. No pallor.  Psychiatric: He has a normal mood and affect. His behavior is normal. Judgment and thought content normal.    ED Course  Procedures (including critical care time) Labs Review Labs Reviewed  CBC WITH DIFFERENTIAL - Abnormal; Notable for the following:    WBC 14.0 (*)    Neutrophils Relative % 86 (*)    Neutro Abs 12.1 (*)    Lymphocytes Relative 5 (*)    Monocytes Absolute 1.2 (*)    All other components within normal limits  COMPREHENSIVE METABOLIC PANEL - Abnormal; Notable for the following:    Sodium 130 (*)    Potassium 3.4 (*)    Chloride 94 (*)    Glucose, Bld 130 (*)    Albumin 3.3 (*)    All other components within normal limits  SEDIMENTATION RATE - Abnormal; Notable for the following:    Sed Rate 56 (*)    All other components within normal limits  CULTURE, BLOOD (ROUTINE X 2)  CULTURE, BLOOD (ROUTINE X 2)  URINE CULTURE  CK  URINALYSIS, ROUTINE W REFLEX MICROSCOPIC  I-STAT CG4 LACTIC ACID, ED    Imaging Review Dg Abd 1 View  03/21/2014   CLINICAL DATA:  Abdominal pain and constipation.  EXAM: ABDOMEN - 1 VIEW  COMPARISON:  Prior radiograph from 12/16/2006  FINDINGS: Multiple mildly prominent gas-filled loops of bowel are seen scattered throughout the abdomen. Gas is seen distally within the rectal vault. No air-fluid levels identified. No radiographic evidence of obstruction. No abnormal bowel wall thickening identified. Small amount of retained stool present within the right and left colon. Stool present within the  rectal vault. No free intraperitoneal air.  No soft tissue mass or abnormal calcification.  No acute osseous abnormality.  IMPRESSION: 1. Multiple mildly prominent gas-filled loops of bowel without evidence of mechanical bowel obstruction. 2. Small amount of retained stool within the colon.   Electronically Signed   By: Rise MuBenjamin  McClintock M.D.   On: 03/21/2014 04:33     EKG Interpretation None      MDM   Final diagnoses:  None    28 year old male with worsening low back and lower turbinate pain, constipation, fevers myalgias.  Patient is an IV drug user.  Concern for epidural abscess, psoas abscess, or other sequela of his IV drug use.  Patient appears ill.  Will start empiric antibiotics to cover for bacteremia.  Patient did have lumbar and  thoracic MRI scans this morning.  7:16 AM Care passed to Dr Rosalia Hammers awaiting MRI  Olivia Mackie, MD 03/21/14 [phone]

## 2014-03-21 NOTE — ED Provider Notes (Signed)
Patient signed out to me by Dr. Norlene Campbell. 28 year old male with history of IV drug abuse who presents today complaining of 5 days of back pain with leg weakness. He reports a fever at home of 100.2. He reports that he has been unable to walk for the past 3 days do to pain and weakness. He denies any loss of sensation or loss of bowel or bladder control.He complains of diffuse discomfort with most severe pain in low back, bilateral buttocks, and thighs.  He states he is too weak to walk and also has pain with any movement.  Dr. Norlene Campbell has obtained labs, cultures, and given iv antibiotics.  The plan is to obtain mri of back to assess for epidural abscess.  He will subsequently be admitted to the appropriate service.  Results for orders placed during the hospital encounter of 03/21/14  CBC WITH DIFFERENTIAL      Result Value Ref Range   WBC 14.0 (*) 4.0 - 10.5 K/uL   RBC 4.61  4.22 - 5.81 MIL/uL   Hemoglobin 14.8  13.0 - 17.0 g/dL   HCT 46.9  62.9 - 52.8 %   MCV 90.2  78.0 - 100.0 fL   MCH 32.1  26.0 - 34.0 pg   MCHC 35.6  30.0 - 36.0 g/dL   RDW 41.3  24.4 - 01.0 %   Platelets 256  150 - 400 K/uL   Neutrophils Relative % 86 (*) 43 - 77 %   Neutro Abs 12.1 (*) 1.7 - 7.7 K/uL   Lymphocytes Relative 5 (*) 12 - 46 %   Lymphs Abs 0.7  0.7 - 4.0 K/uL   Monocytes Relative 9  3 - 12 %   Monocytes Absolute 1.2 (*) 0.1 - 1.0 K/uL   Eosinophils Relative 0  0 - 5 %   Eosinophils Absolute 0.0  0.0 - 0.7 K/uL   Basophils Relative 0  0 - 1 %   Basophils Absolute 0.0  0.0 - 0.1 K/uL  COMPREHENSIVE METABOLIC PANEL      Result Value Ref Range   Sodium 130 (*) 137 - 147 mEq/L   Potassium 3.4 (*) 3.7 - 5.3 mEq/L   Chloride 94 (*) 96 - 112 mEq/L   CO2 23  19 - 32 mEq/L   Glucose, Bld 130 (*) 70 - 99 mg/dL   BUN 13  6 - 23 mg/dL   Creatinine, Ser 2.72  0.50 - 1.35 mg/dL   Calcium 9.5  8.4 - 53.6 mg/dL   Total Protein 7.4  6.0 - 8.3 g/dL   Albumin 3.3 (*) 3.5 - 5.2 g/dL   AST 18  0 - 37 U/L   ALT 15  0 - 53  U/L   Alkaline Phosphatase 94  39 - 117 U/L   Total Bilirubin 0.8  0.3 - 1.2 mg/dL   GFR calc non Af Amer >90  >90 mL/min   GFR calc Af Amer >90  >90 mL/min  URINALYSIS, ROUTINE W REFLEX MICROSCOPIC      Result Value Ref Range   Color, Urine AMBER (*) YELLOW   APPearance HAZY (*) CLEAR   Specific Gravity, Urine 1.033 (*) 1.005 - 1.030   pH 6.0  5.0 - 8.0   Glucose, UA NEGATIVE  NEGATIVE mg/dL   Hgb urine dipstick NEGATIVE  NEGATIVE   Bilirubin Urine SMALL (*) NEGATIVE   Ketones, ur 15 (*) NEGATIVE mg/dL   Protein, ur 30 (*) NEGATIVE mg/dL   Urobilinogen, UA 1.0  0.0 -  1.0 mg/dL   Nitrite NEGATIVE  NEGATIVE   Leukocytes, UA NEGATIVE  NEGATIVE  SEDIMENTATION RATE      Result Value Ref Range   Sed Rate 56 (*) 0 - 16 mm/hr  CK      Result Value Ref Range   Total CK 72  7 - 232 U/L  URINE MICROSCOPIC-ADD ON      Result Value Ref Range   Squamous Epithelial / LPF RARE  RARE   WBC, UA 0-2  <3 WBC/hpf   RBC / HPF 0-2  <3 RBC/hpf   Bacteria, UA FEW (*) RARE  I-STAT CG4 LACTIC ACID, ED      Result Value Ref Range   Lactic Acid, Venous 1.97  0.5 - 2.2 mmol/L   Mri results reviewed  Discussed with Dr. Earlie CountsHewett.  [facility] reordered.  Discussed results with patient.   The patient's care discussed with radiologist and MRI results reviewed. I again discussed the patient with Dr. Earlie CountsHewett who feels the patient would best be served by being seen by a specialist at another institution. He states that neither he or any of his colleagues whom he has consulted are familiar with an SI joint osteomyelitis. I called [facility] and discussed the patient with Dr. Beverely PaceHoekstra who has accepted the patient in transfer to [facility]. Patient will be sent to the emergency department. I discussed this with the patient his family and they voice understanding.  5:02 PM Patient awaiting transport.  Vital signs continue stable.  Patient with repeat iv dilaudid ordered for ongoing pain. Discussed with Dr. Manus Gunningancour and  he is aware patient is in department should he require further care prior to transport  Hilario Quarryanielle S Costa Jha, MD 03/21/14 [phone]

## 2014-03-21 NOTE — ED Notes (Signed)
Pt c/o bilateral leg pain and back pain. Pt states sx started on Sunday, reports he is unable to bear weight. Also states he has had a fever for the past few days. Pt rates pain 9/10.

## 2014-03-21 NOTE — ED Notes (Addendum)
Pt c/o pain all over x's 5 days.  St's unable to stand due to pain.  Also c/o elevated temp x's 2 days.  Nausea without vomiting.  Pt st's he took Advil 3 hours ago for fever.  Also c/o pain in lower abd.

## 2014-03-21 NOTE — Discharge Planning (Signed)
[facility] Community Liaison ° °Resource guide and my contact information provided. °

## 2014-03-21 NOTE — ED Notes (Signed)
MRI notified pt ankle bracelet removed.

## 2014-03-22 LAB — URINE CULTURE
COLONY COUNT: NO GROWTH
CULTURE: NO GROWTH

## 2014-03-23 LAB — CULTURE, BLOOD (ROUTINE X 2)

## 2014-06-30 ENCOUNTER — Ambulatory Visit (HOSPITAL_COMMUNITY)
Admission: AD | Admit: 2014-06-30 | Discharge: 2014-06-30 | Disposition: A | Discharge status: Home / Self Care | Payer: No Typology Code available for payment source | Source: Intra-hospital | Attending: Psychiatry | Admitting: Psychiatry

## 2014-06-30 NOTE — BH Assessment (Signed)
Assessment Note  [name] is an 28 y.o. male.  -Pt is a walk-in at [facility].  He came in by himself.  Patient states he did call his mother to take him to [facility]. Patient says that he wants to get help because he feels like things are getting to where they were when he tried to commit suicide four years ago.  Patient at that time had cut his wrists.  When asked if he has been thinking about killing himself he says "yes, sometimes."  When asked about a plan he says he does not know.  He has had some SI over the last 6 months.  He complains of not sleeping well due to racing thoughts.  He also has problems with tearfulness and isolating himself although he does talk positively about his girlfriend.  Pt denies HI or A/V hallucinations.  Is able to contract for safety.  Patient said that he went to [facility] in August, about five weeks ago.  He stayed clean from heroin for about 2-3 weeks then relapsed.  Patient has been using 1/2 gram IV heroin daily for the last 2-3 weeks.  Patient last used on 09/07 (today) and used 2/10ths of a gram he said.  Also uses marijuana up to four times per week with the amount varying.    Patient appears willing to come in for inpatient care.  When told he would need to go to [facility] for med clearance he said that he had already called his mother that he would need to go to the hospital.  Patient wanted to call his mother and made the call from the phone in lobby.  According to security he did get in a car with a male.  This clinician called Aurther Loft (Press photographer) at [facility] and let her know that patient would be coming over.  Axis I: Major Depression, single episode and Substance Abuse Axis II: Deferred Axis III:  Past Medical History  Diagnosis Date  . Depression   . GERD (gastroesophageal reflux disease)    Axis IV: economic problems, occupational problems, other psychosocial or environmental problems and problems related to legal system/crime Axis V: 51-60 moderate  symptoms  Past Medical History:  Past Medical History  Diagnosis Date  . Depression   . GERD (gastroesophageal reflux disease)     Past Surgical History  Procedure Laterality Date  . Thumb surgery    . Abdominal surgery      Family History: No family history on file.  Social History:  reports that he has been smoking Cigarettes.  He has a 3 pack-year smoking history. He has never used smokeless tobacco. He reports that he uses illicit drugs (Marijuana and Cocaine). He reports that he does not drink alcohol.  Additional Social History:  Alcohol / Drug Use Pain Medications: None Prescriptions: None Over the Counter: None History of alcohol / drug use?: Yes Withdrawal Symptoms: Cramps;Diarrhea;Fever / Chills;Nausea / Vomiting;Patient aware of relationship between substance abuse and physical/medical complications;Sweats;Weakness Substance #1 Name of Substance 1: Heroin 1 - Age of First Use: 28 years of age 612 - Amount (size/oz): 1/2 gram IV 1 - Frequency: Daily 1 - Duration: Last 2-3 weeks.  Had been detox at [facility] around the middle of August. 1 - Last Use / Amount: 09/07  "Two tenths of a gram." Substance #2 Name of Substance 2: Marijuana 2 - Age of First Use: 28 years of age 61 - Amount (size/oz): Varies 2 - Frequency: <1x/W 2 - Duration: On-going  2 - Last Use / Amount: 09/07 one joint  CIWA:   COWS:    Allergies:  Allergies  Allergen Reactions  . Tylenol [Acetaminophen] Anaphylaxis, Swelling and Rash    Pt states that the reaction was only mild throat swelling and cotton mouth  . Ibuprofen Hives and Swelling    Mild throat swelling and cotton mouth  . Ultram [Tramadol Hcl] Hives and Swelling    Mild throat swelling and cotton mouth    Home Medications:  (Not in a hospital admission)  OB/GYN Status:  No LMP for male patient.  General Assessment Data Location of Assessment: [facility] Is this a Tele or Face-to-Face Assessment?: Face-to-Face Is  this an Initial Assessment or a Re-assessment for this encounter?: Initial Assessment Living Arrangements: Parent (with mother) Can pt return to current living arrangement?: Yes Admission Status: Voluntary Is patient capable of signing voluntary admission?: Yes Transfer from: Home Referral Source: Self/Family/Friend  Medical Screening Exam [facility] Walk-in ONLY) Medical Exam completed: No Reason for MSE not completed:  (Pt sent to [facility] for medical clearance.)  [facility] Crisis Care Plan Living Arrangements: Parent (with mother) Name of Psychiatrist: N/A Name of Therapist: N/A     Risk to self with the past 6 months Suicidal Ideation: Yes-Currently Present Suicidal Intent: No Is patient at risk for suicide?: No Suicidal Plan?: No-Not Currently/Within Last 6 Months ("Don't know what I would do.") Access to Means: Yes Specify Access to Suicidal Means: Could get sharps.  Hx of cutting 4 years ago. What has been your use of drugs/alcohol within the last 12 months?: IV Heroin use & THC Previous Attempts/Gestures: Yes How many times?: 3 Other Self Harm Risks: None Triggers for Past Attempts: Unpredictable Intentional Self Injurious Behavior: None Family Suicide History: No Recent stressful life event(s): Financial Problems;Other (Comment) (Pt states that "life" is stressful.  ) Persecutory voices/beliefs?: No Depression: Yes Depression Symptoms: Despondent;Insomnia;Tearfulness;Isolating;Feeling worthless/self pity Substance abuse history and/or treatment for substance abuse?: Yes Suicide prevention information given to non-admitted patients: Not applicable  Risk to Others within the past 6 months Homicidal Ideation: No Thoughts of Harm to Others: No Current Homicidal Intent: No Current Homicidal Plan: No Access to Homicidal Means: No Identified Victim: No one History of harm to others?: No Assessment of Violence: None Noted Violent Behavior Description: None Does patient have access  to weapons?: No Criminal Charges Pending?: No (Pt is on probation for larceny of motor vehicle.) Does patient have a court date: No (Probation is finished in February of 2016.)  Psychosis Hallucinations: None noted Delusions: None noted  Mental Status Report Appear/Hygiene: Disheveled Eye Contact: Good Motor Activity: Freedom of movement;Unremarkable Speech: Logical/coherent Level of Consciousness: Alert Mood: Depressed;Anxious;Despair;Sad Affect: Anxious;Sad Anxiety Level: Panic Attacks Panic attack frequency: About four times in a week Most recent panic attack: Yesterday Thought Processes: Coherent;Relevant Judgement: Unimpaired Orientation: Person;Place;Time;Situation Obsessive Compulsive Thoughts/Behaviors: None  Cognitive Functioning Concentration: Decreased Memory: Recent Impaired;Remote Intact IQ: Average Insight: Good Impulse Control: Good Appetite: Poor Weight Loss: 3 Weight Gain: 0 Sleep: Decreased Total Hours of Sleep:  (<4H/D) Vegetative Symptoms: Staying in bed  ADLScreening [facility] Assessment Services) Patient's cognitive ability adequate to safely complete daily activities?: Yes Patient able to express need for assistance with ADLs?: Yes Independently performs ADLs?: Yes (appropriate for developmental age)  Prior Inpatient Therapy Prior Inpatient Therapy: Yes Prior Therapy Dates: August 2015 Prior Therapy Facilty/Provider(s): [facility] Reason for Treatment: SA.  Heroin use  Prior Outpatient Therapy Prior Outpatient Therapy: No Prior Therapy Dates: N/A Prior  Therapy Facilty/Provider(s): N/a Reason for Treatment: N/A  ADL Screening (condition at time of admission) Patient's cognitive ability adequate to safely complete daily activities?: Yes Is the patient deaf or have difficulty hearing?: No Does the patient have difficulty seeing, even when wearing glasses/contacts?: No Does the patient have difficulty concentrating, remembering, or making decisions?:  No Patient able to express need for assistance with ADLs?: Yes Does the patient have difficulty dressing or bathing?: No Independently performs ADLs?: Yes (appropriate for developmental age) Does the patient have difficulty walking or climbing stairs?: No Weakness of Legs: None Weakness of Arms/Hands: None       Abuse/Neglect Assessment (Assessment to be complete while patient is alone) Physical Abuse: Denies Verbal Abuse: Denies Sexual Abuse: Denies Exploitation of patient/patient's resources: Denies Self-Neglect: Denies     Merchant navy officer (For Healthcare) Does patient have an advance directive?: No Would patient like information on creating an advanced directive?: No - patient declined information    Additional Information 1:1 In Past 12 Months?: No CIRT Risk: No Elopement Risk: No Does patient have medical clearance?: No (Pt went to [facility] for med clearance.)     Disposition:  Disposition Initial Assessment Completed for this Encounter: Yes Disposition of Patient: Other dispositions Type of treatment offered and refused: Other (Comment) (N/A) Other disposition(s): Other (Comment) (Pt sent to [facility] for med clearance.)  On Site Evaluation by:   Reviewed with Physician:    Beatriz Stallion Ray 06/30/2014 11:00 PM

## 2014-06-30 NOTE — [facility] Counselor (Signed)
Pt initially came to front desk at [facility] at 3:30. He filled out screening tool and answered questions that he needed detox. Writer went into lobby to question pt further and pt stated that he needed detox from heroin and cocaine. Pt denies SI and HI and denies [facility]. Writer explained that [facility] doesn't have a heroin or cocaine protocol and discussed w/ pt contacting [facility] and [facility]. Writer gave pt list of SA resources and pt sts he would call his mom would pick him up at 6pm. At 6:15 pm, Miss Garlan Fair at the front desk called Clinical research associate to say pt was at front desk asking for Clinical research associate. Writer went into lobby and talked w/ pt. Pt became teary and said that his mother "wants me to be honest about why I'm here". Pt says, "I don't want to be put in a padded room." Upon further questioning by writer, pt endorses SI and says he is afraid he may hurt himself. Pt points to large scar on pt's inner wrists and states that he slit his wrists four years ago and the laceration required "7 staples". Pt says he feels the same way he did when he attempted suicide. [facility] Writer explained evaluation process to pt. Pt then says, "I'm sorry I lied." Writer expressed her appreciation that pt has been honest with Clinical research associate and that pt's safety is off utmost concern to her and the other staff. Pt will be evaluated by oncoming [facility] staff.    Evette Cristal, [place] Assessment Counselor

## 2015-04-20 ENCOUNTER — Encounter (HOSPITAL_COMMUNITY): Payer: Self-pay | Admitting: Emergency Medicine

## 2015-04-20 ENCOUNTER — Emergency Department (HOSPITAL_COMMUNITY)
Admission: EM | Admit: 2015-04-20 | Discharge: 2015-04-21 | Discharge status: Against Medical Advice | Payer: Self-pay | Attending: Emergency Medicine | Admitting: Emergency Medicine

## 2015-04-20 DIAGNOSIS — Z720 Tobacco use: Secondary | ICD-10-CM | POA: Insufficient documentation

## 2015-04-20 DIAGNOSIS — Y998 Other external cause status: Secondary | ICD-10-CM | POA: Insufficient documentation

## 2015-04-20 DIAGNOSIS — S0181XA Laceration without foreign body of other part of head, initial encounter: Secondary | ICD-10-CM | POA: Insufficient documentation

## 2015-04-20 DIAGNOSIS — Y9351 Activity, roller skating (inline) and skateboarding: Secondary | ICD-10-CM | POA: Insufficient documentation

## 2015-04-20 DIAGNOSIS — Z8719 Personal history of other diseases of the digestive system: Secondary | ICD-10-CM | POA: Insufficient documentation

## 2015-04-20 DIAGNOSIS — Y9289 Other specified places as the place of occurrence of the external cause: Secondary | ICD-10-CM | POA: Insufficient documentation

## 2015-04-20 DIAGNOSIS — Z8659 Personal history of other mental and behavioral disorders: Secondary | ICD-10-CM | POA: Insufficient documentation

## 2015-04-20 DIAGNOSIS — S0993XA Unspecified injury of face, initial encounter: Secondary | ICD-10-CM

## 2015-04-20 MED ORDER — FENTANYL CITRATE (PF) 100 MCG/2ML IJ SOLN
50.0000 ug | Freq: Once | INTRAMUSCULAR | Status: AC
Start: 2015-04-20 — End: 2015-04-20
  Administered 2015-04-20: 50 ug via NASAL
  Filled 2015-04-20: qty 2

## 2015-04-20 MED ORDER — LIDOCAINE-EPINEPHRINE (PF) 2 %-1:200000 IJ SOLN
20.0000 mL | Freq: Once | INTRAMUSCULAR | Status: DC
  Filled 2015-04-20: qty 20

## 2015-04-20 NOTE — ED Provider Notes (Signed)
CSN: [id number]     Arrival date & time 04/20/15  2110 History  This chart was scribed for non-physician practitioner, Dierdre ForthHannah Adrien Dietzman, PA-C working with Pricilla LovelessScott Goldston, MD, by Jarvis Morganaylor Ferguson, ED Scribe. This patient was seen in room TR09C/TR09C and the patient's care was started at 10:24 PM.    Chief Complaint  Patient presents with  . Facial Injury    The history is provided by the patient and medical records. No language interpreter was used.    HPI Comments: [name] is a 29 y.o. male who presents to the Emergency Department complaining of a laceration to his right cheek that occurred PTA. Pt states he lost his balance while skateboarding and fell and hit his right cheek on a rusty metal lawn chair. Pt reports he had a cyst to his right cheek prior to the injury and after the injury the cyst ruptured with bloody and yellow discharge. Pt reports he lost consciousness for at least 1 minute after the incident. He is unsure when his last tetanus vaccination was. He denies any neck pain, blurred vision, or dizziness.   Past Medical History  Diagnosis Date  . Depression   . GERD (gastroesophageal reflux disease)    Past Surgical History  Procedure Laterality Date  . Thumb surgery    . Abdominal surgery     No family history on file. History  Substance Use Topics  . Smoking status: Current Every Day Smoker -- 0.00 packs/day for 6 years    Types: Cigarettes  . Smokeless tobacco: Never Used  . Alcohol Use: No    Review of Systems  Constitutional: Negative for fever and chills.  HENT: Negative for dental problem, facial swelling and nosebleeds.   Eyes: Negative for visual disturbance.  Respiratory: Negative for cough, chest tightness, shortness of breath, wheezing and stridor.   Cardiovascular: Negative for chest pain.  Gastrointestinal: Negative for nausea, vomiting and abdominal pain.  Genitourinary: Negative for dysuria, hematuria and flank pain.  Musculoskeletal:  Negative for back pain, joint swelling, arthralgias, gait problem, neck pain and neck stiffness.  Skin: Positive for wound (lac to right cheek). Negative for rash.  Neurological: Positive for syncope and headaches. Negative for dizziness, weakness, light-headedness and numbness.  Hematological: Does not bruise/bleed easily.  Psychiatric/Behavioral: The patient is not nervous/anxious.   All other systems reviewed and are negative.     Allergies  Tylenol; Ibuprofen; and Ultram  Home Medications   Prior to Admission medications   Medication Sig Start Date End Date Taking? Authorizing Provider  ibuprofen (ADVIL,MOTRIN) 200 MG tablet Take 1,400 mg by mouth every 6 (six) hours as needed for headache or mild pain.     Historical Provider, MD   Triage Vitals: BP 114/64 mmHg  Pulse 70  Temp(Src) 98.3 F (36.8 C) (Oral)  Resp 18  Ht 5\' 9"  (1.753 m)  Wt 155 lb (70.308 kg)  BMI 22.88 kg/m2  SpO2 95%  Physical Exam  Constitutional: He is oriented to person, place, and time. He appears well-developed and well-nourished. No distress.  HENT:  Head: Normocephalic.  Right Ear: Tympanic membrane, external ear and ear canal normal.  Left Ear: Tympanic membrane, external ear and ear canal normal.  Nose: Nose normal. No epistaxis. Right sinus exhibits no maxillary sinus tenderness and no frontal sinus tenderness. Left sinus exhibits no maxillary sinus tenderness and no frontal sinus tenderness.  Mouth/Throat: Uvula is midline, oropharynx is clear and moist and mucous membranes are normal. Mucous membranes are not pale  and not cyanotic. No oropharyngeal exudate, posterior oropharyngeal edema, posterior oropharyngeal erythema or tonsillar abscesses.  2cm Laceration to the left side of the face with cystic material protruding.  Significant TTP of the facial bones of the right maxilla.    Eyes: EOM are normal. Pupils are equal, round, and reactive to light. Right conjunctiva is injected. Left conjunctiva  is injected. No scleral icterus.  No horizontal, vertical or rotational nystagmus  Neck: Normal range of motion and full passive range of motion without pain. Neck supple.  Full active and passive ROM without pain No midline or paraspinal tenderness No nuchal rigidity or meningeal signs  Cardiovascular: Normal rate, regular rhythm, normal heart sounds and intact distal pulses.   No murmur heard. Pulmonary/Chest: Effort normal and breath sounds normal. No stridor. No respiratory distress. He has no wheezes. He has no rales.  Clear and equal breath sounds without focal wheezes, rhonchi, rales  Abdominal: Soft. Bowel sounds are normal. There is no tenderness. There is no rebound and no guarding.  Musculoskeletal: Normal range of motion.  Lymphadenopathy:    He has no cervical adenopathy.  Neurological: He is alert and oriented to person, place, and time. He has normal reflexes. No cranial nerve deficit. He exhibits normal muscle tone. Coordination normal.  Mental Status:  Alert, oriented, thought content appropriate. Speech fluent without evidence of aphasia. Able to follow 2 step commands without difficulty.  Pt slurring words Cranial Nerves:  II:  Pupils equal, round, reactive to light III,IV, VI: ptosis not present, extra-ocular motions intact bilaterally  VIII: hearing grossly normal bilaterally  IX,X: gag reflex present  XI: bilateral shoulder shrug equal and strong XII: midline tongue extension  Motor:  5/5 in upper and lower extremities bilaterally including strong and equal grip strength and dorsiflexion/plantar flexion Sensory: Pinprick and light touch normal in all extremities.  Deep Tendon Reflexes: 2+ and symmetric  Cerebellar: normal finger-to-nose with bilateral upper extremities Gait: normal gait, pt appears off balance CV: distal pulses palpable throughout   Skin: Skin is warm and dry. No rash noted. He is not diaphoretic.  Psychiatric: He has a normal mood and affect.  His behavior is normal. Judgment and thought content normal.  Nursing note and vitals reviewed.   ED Course  Procedures (including critical care time)  DIAGNOSTIC STUDIES: Oxygen Saturation is 95% on RA, normal by my interpretation.    COORDINATION OF CARE:    Labs Review Labs Reviewed - No data to display  Imaging Review No results found.   EKG Interpretation None      MDM   Final diagnoses:  Facial trauma, initial encounter   [name] presents with right sided facial laceration from a metal chair.  Will update TDAP.  CT scan ordered due to swelling and tenderness to the bony structures of the right face.  Pt without   1:24 AM Pt continues to wait for CT scan.  He was sleeping soundly and awoken to clean his wounds.  Wound cleaned though pt was generally uncooperative with this continuing to be belligerent, demanding pain medicine and refusing to allow me to anesthetize the site.  He is continuing to slur his words.  Pt was given Fentanyl upon arrival and oxycodone was ordered however pt is now refusing all treatments including the CT scan and demanding to speak to a physician.   Dr. Preston Fleeting notified.  Oxycodone held until pt can be evaluated by Dr. Preston Fleeting.   1:53 AM Pt has now decided that  he is going to leave.  Risk and benefit discussed.  I again offered lidocaine and repair of the wound along with other treatments and he continues to refuse.  He ambulates without assistance.  When asked to ensure his understanding pt refuses to answer questions.  Pt then walked out of the ED AMA.    BP 114/64 mmHg  Pulse 70  Temp(Src) 98.3 F (36.8 C) (Oral)  Resp 18  Ht 5\' 9"  (1.753 m)  Wt 155 lb (70.308 kg)  BMI 22.88 kg/m2  SpO2 95%  I personally performed the services described in this documentation, which was scribed in my presence. The recorded information has been reviewed and is accurate.   Dahlia Client Daleah Coulson, PA-C 04/21/15 0454  Pricilla Loveless, MD 04/21/15  [phone]

## 2015-04-20 NOTE — ED Notes (Signed)
PA at bedside.

## 2015-04-20 NOTE — ED Notes (Signed)
Pt. lost his balance and fell while riding his skateboard this evening , presents with laceration/swelling  at right cheek , denies LOC / alert and oriented/respierations unlabored .

## 2015-04-21 MED ORDER — TETANUS-DIPHTH-ACELL PERTUSSIS 5-2.5-18.5 LF-MCG/0.5 IM SUSP: 0.5000 mL | Freq: Once | INTRAMUSCULAR | Status: DC

## 2015-04-21 MED ORDER — OXYCODONE HCL 5 MG PO TABS
5.0000 mg | ORAL_TABLET | Freq: Once | ORAL | Status: DC
  Filled 2015-04-21: qty 1

## 2015-04-21 NOTE — ED Notes (Signed)
Pt walked out of department

## 2015-04-21 NOTE — ED Notes (Signed)
Pt requested pain meds and refused CT scan Pt again came out of the room and requested more pain meds. Pt stated he was going to go and get a drink. Pt was told by PA Dahlia ClientHannah that if he left he would have to check back in.

## 2015-04-21 NOTE — ED Notes (Signed)
Pt unavailable to go over discharge information or to sign AMA documentation

## 2015-04-21 NOTE — ED Notes (Addendum)
Pt in hallway - talking with Dahlia ClientHannah PA - pt stating that he wants to leave and to talk to the doctor. Doctor to be informed by Gaspar ColaHannah PA   Hannah PA asked that this RN hold medication until a physician had assessed the pt.

## 2015-04-21 NOTE — ED Notes (Signed)
Pt not in room. Personal belongings are not in pts room.

## 2015-04-22 ENCOUNTER — Emergency Department (HOSPITAL_COMMUNITY): Payer: Self-pay

## 2015-04-22 ENCOUNTER — Emergency Department (HOSPITAL_COMMUNITY)
Admission: EM | Admit: 2015-04-22 | Discharge: 2015-04-22 | Discharge status: Against Medical Advice | Payer: Self-pay | Attending: Emergency Medicine | Admitting: Emergency Medicine

## 2015-04-22 ENCOUNTER — Encounter (HOSPITAL_COMMUNITY): Payer: Self-pay

## 2015-04-22 ENCOUNTER — Encounter: Payer: Self-pay | Admitting: Medical Oncology

## 2015-04-22 ENCOUNTER — Inpatient Hospital Stay
Admission: EM | Admit: 2015-04-22 | Discharge: 2015-04-26 | DRG: 603 | Disposition: A | Discharge status: Home / Self Care | Payer: Self-pay | Attending: Internal Medicine | Admitting: Internal Medicine

## 2015-04-22 DIAGNOSIS — K219 Gastro-esophageal reflux disease without esophagitis: Secondary | ICD-10-CM | POA: Diagnosis present

## 2015-04-22 DIAGNOSIS — F329 Major depressive disorder, single episode, unspecified: Secondary | ICD-10-CM | POA: Diagnosis present

## 2015-04-22 DIAGNOSIS — Z8659 Personal history of other mental and behavioral disorders: Secondary | ICD-10-CM | POA: Insufficient documentation

## 2015-04-22 DIAGNOSIS — L0201 Cutaneous abscess of face: Secondary | ICD-10-CM | POA: Insufficient documentation

## 2015-04-22 DIAGNOSIS — Z87828 Personal history of other (healed) physical injury and trauma: Secondary | ICD-10-CM | POA: Insufficient documentation

## 2015-04-22 DIAGNOSIS — F17210 Nicotine dependence, cigarettes, uncomplicated: Secondary | ICD-10-CM | POA: Diagnosis present

## 2015-04-22 DIAGNOSIS — L03211 Cellulitis of face: Secondary | ICD-10-CM | POA: Diagnosis present

## 2015-04-22 DIAGNOSIS — Z720 Tobacco use: Secondary | ICD-10-CM | POA: Insufficient documentation

## 2015-04-22 DIAGNOSIS — H538 Other visual disturbances: Secondary | ICD-10-CM | POA: Diagnosis present

## 2015-04-22 DIAGNOSIS — R110 Nausea: Secondary | ICD-10-CM | POA: Insufficient documentation

## 2015-04-22 DIAGNOSIS — Z8719 Personal history of other diseases of the digestive system: Secondary | ICD-10-CM | POA: Insufficient documentation

## 2015-04-22 DIAGNOSIS — L0291 Cutaneous abscess, unspecified: Secondary | ICD-10-CM

## 2015-04-22 DIAGNOSIS — R509 Fever, unspecified: Secondary | ICD-10-CM | POA: Insufficient documentation

## 2015-04-22 DIAGNOSIS — Z8614 Personal history of Methicillin resistant Staphylococcus aureus infection: Secondary | ICD-10-CM

## 2015-04-22 LAB — BASIC METABOLIC PANEL
Anion gap: 12 (ref 5–15)
BUN: 9 mg/dL (ref 6–20)
CALCIUM: 9.3 mg/dL (ref 8.9–10.3)
CHLORIDE: 107 mmol/L (ref 101–111)
CO2: 20 mmol/L — AB (ref 22–32)
Creatinine, Ser: 0.92 mg/dL (ref 0.61–1.24)
GFR calc Af Amer: >60 mL/min (ref >60)
Glucose, Bld: 106 mg/dL — ABNORMAL HIGH (ref 65–99)
Potassium: 3.4 mmol/L — ABNORMAL LOW (ref 3.5–5.1)
Sodium: 139 mmol/L (ref 135–145)

## 2015-04-22 LAB — URINE MICROSCOPIC-ADD ON

## 2015-04-22 LAB — URINALYSIS, ROUTINE W REFLEX MICROSCOPIC
GLUCOSE, UA: 100 mg/dL — AB
Hgb urine dipstick: NEGATIVE
KETONES UR: 40 mg/dL — AB
LEUKOCYTES UA: NEGATIVE
Nitrite: POSITIVE — AB
PH: 6 (ref 5.0–8.0)
Protein, ur: NEGATIVE mg/dL
Specific Gravity, Urine: 1.03 (ref 1.005–1.030)
Urobilinogen, UA: 2 mg/dL — ABNORMAL HIGH (ref 0.0–1.0)

## 2015-04-22 LAB — CBC WITH DIFFERENTIAL/PLATELET
BASOS ABS: 0 10*3/uL (ref 0.0–0.1)
Basophils Relative: 0 % (ref 0–1)
Eosinophils Absolute: 0 10*3/uL (ref 0.0–0.7)
Eosinophils Relative: 0 % (ref 0–5)
HCT: 47.5 % (ref 39.0–52.0)
HEMOGLOBIN: 17 g/dL (ref 13.0–17.0)
Lymphocytes Relative: 9 % — ABNORMAL LOW (ref 12–46)
Lymphs Abs: 1.1 10*3/uL (ref 0.7–4.0)
MCH: 32 pg (ref 26.0–34.0)
MCHC: 35.8 g/dL (ref 30.0–36.0)
MCV: 89.5 fL (ref 78.0–100.0)
MONOS PCT: 7 % (ref 3–12)
Monocytes Absolute: 0.8 10*3/uL (ref 0.1–1.0)
NEUTROS ABS: 10.7 10*3/uL — AB (ref 1.7–7.7)
Neutrophils Relative %: 84 % — ABNORMAL HIGH (ref 43–77)
Platelets: 201 10*3/uL (ref 150–400)
RBC: 5.31 MIL/uL (ref 4.22–5.81)
RDW: 12.7 % (ref 11.5–15.5)
WBC: 12.6 10*3/uL — ABNORMAL HIGH (ref 4.0–10.5)

## 2015-04-22 MED ORDER — HYDROMORPHONE HCL 1 MG/ML IJ SOLN
1.0000 mg | INTRAMUSCULAR | Status: DC | PRN
  Administered 2015-04-22 – 2015-04-26 (×18): 1 mg via INTRAVENOUS
  Filled 2015-04-22 (×18): qty 1

## 2015-04-22 MED ORDER — SODIUM CHLORIDE 0.9 % IV BOLUS (SEPSIS)
1000.0000 mL | INTRAVENOUS | Status: AC
  Administered 2015-04-22: 1000 mL via INTRAVENOUS

## 2015-04-22 MED ORDER — MORPHINE SULFATE 4 MG/ML IJ SOLN
4.0000 mg | Freq: Once | INTRAMUSCULAR | Status: AC
  Administered 2015-04-22: 4 mg via INTRAVENOUS

## 2015-04-22 MED ORDER — ONDANSETRON HCL 4 MG PO TABS: 4.0000 mg | ORAL_TABLET | Freq: Four times a day (QID) | ORAL | Status: DC | PRN

## 2015-04-22 MED ORDER — VANCOMYCIN HCL IN DEXTROSE 1-5 GM/200ML-% IV SOLN
INTRAVENOUS | Status: AC
  Filled 2015-04-22: qty 200

## 2015-04-22 MED ORDER — ONDANSETRON HCL 4 MG/2ML IJ SOLN
INTRAMUSCULAR | Status: AC
  Filled 2015-04-22: qty 2

## 2015-04-22 MED ORDER — NICOTINE 21 MG/24HR TD PT24
21.0000 mg | MEDICATED_PATCH | Freq: Every day | TRANSDERMAL | Status: DC
Start: 2015-04-23 — End: 2015-04-26
  Administered 2015-04-23: 21 mg via TRANSDERMAL
  Filled 2015-04-22 (×2): qty 1

## 2015-04-22 MED ORDER — IOHEXOL 300 MG/ML  SOLN
80.0000 mL | Freq: Once | INTRAMUSCULAR | Status: AC | PRN
  Administered 2015-04-22: 80 mL via INTRAVENOUS

## 2015-04-22 MED ORDER — IBUPROFEN 800 MG PO TABS: 800.0000 mg | ORAL_TABLET | Freq: Four times a day (QID) | ORAL | Status: DC | PRN

## 2015-04-22 MED ORDER — PNEUMOCOCCAL VAC POLYVALENT 25 MCG/0.5ML IJ INJ: 0.5000 mL | INJECTION | INTRAMUSCULAR | Status: DC

## 2015-04-22 MED ORDER — LIDOCAINE HCL (PF) 1 % IJ SOLN
INTRAMUSCULAR | Status: AC
  Filled 2015-04-22: qty 5

## 2015-04-22 MED ORDER — VANCOMYCIN HCL IN DEXTROSE 1-5 GM/200ML-% IV SOLN
1000.0000 mg | INTRAVENOUS | Status: DC
  Administered 2015-04-22: 1000 mg via INTRAVENOUS
  Filled 2015-04-22: qty 200

## 2015-04-22 MED ORDER — CLINDAMYCIN PHOSPHATE 600 MG/50ML IV SOLN
INTRAVENOUS | Status: AC
  Filled 2015-04-22: qty 50

## 2015-04-22 MED ORDER — CLINDAMYCIN PHOSPHATE 600 MG/50ML IV SOLN
600.0000 mg | Freq: Once | INTRAVENOUS | Status: AC
  Administered 2015-04-22: 600 mg via INTRAVENOUS

## 2015-04-22 MED ORDER — HEPARIN SODIUM (PORCINE) 5000 UNIT/ML IJ SOLN
5000.0000 [IU] | Freq: Three times a day (TID) | INTRAMUSCULAR | Status: DC
  Administered 2015-04-22 – 2015-04-23 (×2): 5000 [IU] via SUBCUTANEOUS
  Filled 2015-04-22 (×5): qty 1

## 2015-04-22 MED ORDER — ONDANSETRON HCL 4 MG/2ML IJ SOLN: 4.0000 mg | Freq: Four times a day (QID) | INTRAMUSCULAR | Status: DC | PRN

## 2015-04-22 MED ORDER — LIDOCAINE-EPINEPHRINE (PF) 2 %-1:200000 IJ SOLN: 20.0000 mL | Freq: Once | INTRAMUSCULAR | Status: DC

## 2015-04-22 MED ORDER — OXYCODONE HCL 5 MG PO TABS
5.0000 mg | ORAL_TABLET | ORAL | Status: DC | PRN
  Administered 2015-04-23 (×3): 5 mg via ORAL
  Filled 2015-04-22 (×3): qty 1

## 2015-04-22 MED ORDER — MORPHINE SULFATE 4 MG/ML IJ SOLN
INTRAMUSCULAR | Status: AC
Start: 2015-04-22 — End: 2015-04-23
  Filled 2015-04-22: qty 1

## 2015-04-22 MED ORDER — LIDOCAINE HCL (PF) 1 % IJ SOLN
5.0000 mL | Freq: Once | INTRAMUSCULAR | Status: AC
  Administered 2015-04-22: 5 mL

## 2015-04-22 MED ORDER — OXYCODONE HCL 5 MG PO TABS
10.0000 mg | ORAL_TABLET | Freq: Once | ORAL | Status: AC
  Administered 2015-04-22: 10 mg via ORAL
  Filled 2015-04-22: qty 2

## 2015-04-22 MED ORDER — ONDANSETRON HCL 4 MG/2ML IJ SOLN
4.0000 mg | INTRAMUSCULAR | Status: AC
  Administered 2015-04-22: 4 mg via INTRAVENOUS

## 2015-04-22 MED ORDER — MORPHINE SULFATE 4 MG/ML IJ SOLN
INTRAMUSCULAR | Status: AC
  Administered 2015-04-22: 4 mg via INTRAVENOUS
  Filled 2015-04-22: qty 1

## 2015-04-22 MED ORDER — MORPHINE SULFATE 4 MG/ML IJ SOLN
INTRAMUSCULAR | Status: AC
  Filled 2015-04-22: qty 1

## 2015-04-22 MED ORDER — HEPARIN SODIUM (PORCINE) 5000 UNIT/ML IJ SOLN
INTRAMUSCULAR | Status: AC
  Filled 2015-04-22: qty 1

## 2015-04-22 NOTE — ED Notes (Signed)
Pt reports that he had a bump on the rt side of his face and he popped it, since then he has noticed increased swelling to rt side of face with pain.

## 2015-04-22 NOTE — ED Provider Notes (Signed)
CSN: [id number]     Arrival date & time 04/22/15  1041 History   First MD Initiated Contact with Patient 04/22/15 1042     Chief Complaint  Patient presents with  . Insect Bite     (Consider location/radiation/quality/duration/timing/severity/associated sxs/prior Treatment) Patient is a 29 y.o. male presenting with abscess.  Abscess Location:  Head/neck Head/neck abscess location: R cheek. Abscess quality: fluctuance, painful and redness   Red streaking: no   Duration:  3 days Progression:  Unchanged Pain details:    Quality:  No pain   Severity:  Severe   Duration:  3 days   Timing:  Constant   Progression:  Unchanged Chronicity:  New Context comment:  Seen yesterday for same symptoms after rupture with laceration of face, left AMA due to not receiving narcotics Relieved by:  Nothing Worsened by:  Nothing tried Associated symptoms: fever (subjective) and nausea   Associated symptoms: no vomiting     Past Medical History  Diagnosis Date  . Depression   . GERD (gastroesophageal reflux disease)    Past Surgical History  Procedure Laterality Date  . Thumb surgery    . Abdominal surgery     No family history on file. History  Substance Use Topics  . Smoking status: Current Every Day Smoker -- 0.00 packs/day for 6 years    Types: Cigarettes  . Smokeless tobacco: Never Used  . Alcohol Use: No    Review of Systems  Constitutional: Positive for fever (subjective).  Gastrointestinal: Positive for nausea. Negative for vomiting.  All other systems reviewed and are negative.     Allergies  Tylenol; Ibuprofen; and Ultram  Home Medications   Prior to Admission medications   Medication Sig Start Date End Date Taking? Authorizing Provider  ibuprofen (ADVIL,MOTRIN) 200 MG tablet Take 1,400 mg by mouth every 6 (six) hours as needed for headache or mild pain.    Yes Historical Provider, MD   BP 111/62 mmHg  Pulse 56  Temp(Src) 97.9 F (36.6 C) (Oral)  Resp 16  Ht  5\' 9"  (1.753 m)  Wt 155 lb (70.308 kg)  BMI 22.88 kg/m2  SpO2 95% Physical Exam  Constitutional: He is oriented to person, place, and time. He appears well-developed and well-nourished.  HENT:  Head: Normocephalic and atraumatic.  Large amount of swelling to R face with draining from incision inferior to R zygoma  Eyes: Conjunctivae and EOM are normal.  Neck: Normal range of motion. Neck supple.  Cardiovascular: Normal rate, regular rhythm and normal heart sounds.   Pulmonary/Chest: Effort normal and breath sounds normal. No respiratory distress.  Abdominal: He exhibits no distension. There is no tenderness. There is no rebound and no guarding.  Musculoskeletal: Normal range of motion.  Neurological: He is alert and oriented to person, place, and time.  Skin: Skin is warm and dry.  Vitals reviewed.   ED Course  Procedures (including critical care time) Labs Review Labs Reviewed  CBC WITH DIFFERENTIAL/PLATELET - Abnormal; Notable for the following:    WBC 12.6 (*)    Neutrophils Relative % 84 (*)    Neutro Abs 10.7 (*)    Lymphocytes Relative 9 (*)    All other components within normal limits  BASIC METABOLIC PANEL - Abnormal; Notable for the following:    Potassium 3.4 (*)    CO2 20 (*)    Glucose, Bld 106 (*)    All other components within normal limits  URINALYSIS, ROUTINE W REFLEX MICROSCOPIC (NOT AT [facility]) -  Abnormal; Notable for the following:    Color, Urine AMBER (*)    APPearance HAZY (*)    Glucose, UA 100 (*)    Bilirubin Urine MODERATE (*)    Ketones, ur 40 (*)    Urobilinogen, UA 2.0 (*)    Nitrite POSITIVE (*)    All other components within normal limits  URINE MICROSCOPIC-ADD ON    Imaging Review Ct Maxillofacial W/cm  04/22/2015   CLINICAL DATA:  Recent emergency department visit for laceration to right cheek but left against medical advice, refusing treatment. Today, returns with right facial swelling, pain and fever. Possible insect bite.  EXAM: CT  MAXILLOFACIAL WITH CONTRAST  TECHNIQUE: Multidetector CT imaging of the maxillofacial structures was performed with intravenous contrast. Multiplanar CT image reconstructions were also generated. A small metallic BB was placed on the right temple in order to reliably differentiate right from left.  CONTRAST:  80mL OMNIPAQUE IOHEXOL 300 MG/ML  SOLN  COMPARISON:  None.  FINDINGS: There is subcutaneous haziness overlying the right maxilla, with a focal collection of peripherally hyper attenuating fluid, deep to the skin surface, measuring approximately 1.6 x 1.8 cm. Left facial soft tissues are unremarkable. No air-fluid levels in the paranasal sinuses. Small bilateral mastoid effusions, left greater than right. Scattered dental caries. At least 1 periapical lucency involving a left maxillary tooth (series 202, image 43 and series 206, image 57).  IMPRESSION: 1. Subcutaneous abscess overlies the right maxilla, with surrounding edema. 2. Small bilateral mastoid effusions, left greater than right. 3. Scattered dental caries and at least 1 periapical abscess involving a left maxillary tooth.   Electronically Signed   By: Leanna Battles M.D.   On: 04/22/2015 13:50     EKG Interpretation None      MDM   Final diagnoses:  Abscess  Cellulitis of face    29 y.o. male with pertinent PMH of IV drug abuse, recent visit with leaving AMA after he ruptured a cyst of face presents with continued pain.  The pt was extremely volatile with his mood, but oriented, appeared nonintoxicated and capable of making decisions.  Physical exam as above.  I informed him that I would give him oxycodone PO x 1 as he had objective signs of trauma and a large amount of swelling.  He agreed to stay for further care.  Shortly after return of CT scan, pt eloped to waiting room with IV in.  Given his quickness to leave and lack of any notification of staff with ho drug abuse, I feel the pt is very high risk of drug seeking behavior here,  despite a length discussion on his initial presentation where I attempted to allay his concerns re: pain control.  I discussed that it was very important that he have any abscess drained, and he seemed ok with this plan prior to eloping.  He was caught prior to leaving and his IV removed.  He also made mention earlier of "I've got money, I can go buy some smack".   DC AMA/eloped.    I have reviewed all laboratory and imaging studies if ordered as above  1. Abscess   2. Cellulitis of face         Mirian Mo, MD 04/22/15 1435

## 2015-04-22 NOTE — Progress Notes (Signed)
Dr Clint GuyHower notified that pt is requesting for a nicotine patch. He smokes 1pack/day.  MD will place order.

## 2015-04-22 NOTE — ED Provider Notes (Signed)
[facility] Emergency Department Provider Note  ____________________________________________  Time seen: Approximately 8:05 PM  I have reviewed the triage vital signs and the nursing notes.   HISTORY  Chief Complaint Facial Swelling    HPI [name] is a 29 y.o. male with a reportedly distant history of drug abuse presents with 2-3 days of worsening right sided facial swelling and pain.  He states that it started after he noticed that a chronic cyst on his right cheek seemed to be getting infected, so he squeezed on it to charter get the infection out.  After then it gradually but rapidly has gotten worse.  Now the whole right side of his face is swollen and he states his vision is becoming blurry.  He is having difficulty talking due to the facial swelling.  He is having subjective fevers and chills, has not been able to eat much for 2 days, and is in severe pain.  He states that he was seen today at [facility] (which I verified by reviewing the provider notes) but feels he was not taken seriously because of his history of drug abuse.  He is adamant that he has gotten "cleaned up" but that he just needs help for his face and that he is in severe amount of pain.  He did not receive any antibiotics earlier today from [facility], because he left AMA after an argument about pain medicine.  He describes the pain as constant, throbbing, and severe.   Past Medical History  Diagnosis Date  . Depression   . GERD (gastroesophageal reflux disease)     There are no active problems to display for this patient.   Past Surgical History  Procedure Laterality Date  . Thumb surgery    . Abdominal surgery      Current Outpatient Rx  Name  Route  Sig  Dispense  Refill  . ibuprofen (ADVIL,MOTRIN) 200 MG tablet   Oral   Take 1,400 mg by mouth every 6 (six) hours as needed for headache or mild pain.            Allergies Tylenol; Ibuprofen; and Ultram  No family  history on file.  Social History History  Substance Use Topics  . Smoking status: Current Every Day Smoker -- 0.00 packs/day for 6 years    Types: Cigarettes  . Smokeless tobacco: Never Used  . Alcohol Use: No    Review of Systems Constitutional: Subjective fever/chills Eyes: Blurry vision from the right eye  ENT: No sore throat.  He feels some difficulty swallowing Cardiovascular: Denies chest pain. Respiratory: Denies shortness of breath. Gastrointestinal: No abdominal pain.  No nausea, no vomiting.  No diarrhea.  No constipation. +Anorexia Genitourinary: Negative for dysuria. Musculoskeletal: Negative for back pain. Skin: Abscess and swelling right cheek Neurological: Negative for headaches, focal weakness or numbness.  10-point ROS otherwise negative.  ____________________________________________   PHYSICAL EXAM:  VITAL SIGNS: ED Triage Vitals  Enc Vitals Group     BP 04/22/15 1901 104/65 mmHg     Pulse Rate 04/22/15 1901 82     Resp 04/22/15 1901 20     Temp 04/22/15 1901 98 F (36.7 C)     Temp Source 04/22/15 1901 Oral     SpO2 04/22/15 1901 95 %     Weight 04/22/15 1901 150 lb (68.04 kg)     Height 04/22/15 1901 5\' 9"  (1.753 m)     Head Cir --      Peak  Flow --      Pain Score 04/22/15 1901 10     Pain Loc --      Pain Edu? --      Excl. in GC? --     Constitutional: Alert and oriented.  Infection on his right cheek is obvious at first glance.  He appears uncomfortable. Eyes: Conjunctivae are normal. PERRL. EOMI. no chemosis. Head: Atraumatic.  See skin exam below for details of the right cheek Nose: No congestion/rhinnorhea. Mouth/Throat: Mucous membranes are moist.  Oropharynx non-erythematous. Neck: No stridor.   Cardiovascular: Normal rate, regular rhythm. Grossly normal heart sounds.  Good peripheral circulation. Respiratory: Normal respiratory effort.  No retractions. Lungs CTAB. Gastrointestinal: Soft and nontender. No distention. No abdominal  bruits. No CVA tenderness. Musculoskeletal: No lower extremity tenderness nor edema.  No joint effusions.  No track marks on arms. Neurologic:  Normal speech and language. No gross focal neurologic deficits are appreciated. Speech is normal. Skin:  The patient has a large non-draining abscess on his right cheek with significant surrounding cellulitis that includes the entire cheek up to the inferior orbit of his right eye and down to his jaw.  It is severely tender to palpation. Psychiatric: Mood and affect are normal. Speech and behavior are normal.  The patient's interactions are appropriate.  ____________________________________________   LABS (all labs ordered are listed, but only abnormal results are displayed)  Labs Reviewed  CULTURE, BLOOD (ROUTINE X 2)  CULTURE, BLOOD (ROUTINE X 2)   ____________________________________________  EKG  Not indicated ____________________________________________  RADIOLOGY  Aura Camps, Arion Shankles, personally discussed these images and results by phone with the on-call radiologist and used this discussion as part of my medical decision making.   Ct Maxillofacial W/cm  04/22/2015   CLINICAL DATA:  Recent emergency department visit for laceration to right cheek but left against medical advice, refusing treatment. Today, returns with right facial swelling, pain and fever. Possible insect bite.  EXAM: CT MAXILLOFACIAL WITH CONTRAST  TECHNIQUE: Multidetector CT imaging of the maxillofacial structures was performed with intravenous contrast. Multiplanar CT image reconstructions were also generated. A small metallic BB was placed on the right temple in order to reliably differentiate right from left.  CONTRAST:  80mL OMNIPAQUE IOHEXOL 300 MG/ML  SOLN  COMPARISON:  None.  FINDINGS: There is subcutaneous haziness overlying the right maxilla, with a focal collection of peripherally hyper attenuating fluid, deep to the skin surface, measuring approximately 1.6 x 1.8 cm.  Left facial soft tissues are unremarkable. No air-fluid levels in the paranasal sinuses. Small bilateral mastoid effusions, left greater than right. Scattered dental caries. At least 1 periapical lucency involving a left maxillary tooth (series 202, image 43 and series 206, image 57).  IMPRESSION: 1. Subcutaneous abscess overlies the right maxilla, with surrounding edema. 2. Small bilateral mastoid effusions, left greater than right. 3. Scattered dental caries and at least 1 periapical abscess involving a left maxillary tooth.   Electronically Signed   By: Leanna Battles M.D.   On: 04/22/2015 13:50    ____________________________________________   PROCEDURES  Procedure(s) performed: I&D, see procedure note(s).   INCISION AND DRAINAGE Performed by: Loleta Rose Consent: Verbal consent obtained. Risks and benefits: risks, benefits and alternatives were discussed Type: abscess  Body area: right cheek  Anesthesia: local infiltration  Incision was made with a scalpel.  Local anesthetic: lidocaine 1% without epinephrine  Anesthetic total: 5 ml  Complexity: complex Blunt dissection to break up loculations, numerous loculations appreciated during probing  Drainage:  purulent & sanguinous  Drainage amount: copious  Packing material: none  Patient tolerance: Patient tolerated the procedure well with no immediate complications.  Note:  Sac of sebaceous cyst is still visible, but I am uncomfortable continuing to probe within the patient's facial wound.   Critical Care performed: No ____________________________________________   INITIAL IMPRESSION / ASSESSMENT AND PLAN / ED COURSE  Pertinent labs & imaging results that were available during my care of the patient were reviewed by me and considered in my medical decision making (see chart for details).  I reviewed the previous provider's note and understands the concern given the patient's drug history.  Based on his current  presentation and my emergency department, however, I am worried about the rapidly worsening cellulitis with an abscess, both of which were verified by a maxillofacial CT scan earlier today.  I had a open and honest discussion with the patient and his mother.  We will proceed with IV antibiotics, IV analgesia, and I&D of his abscess.  I will admit the patient given the rapid swelling of the facial cellulitis to ensure that he is improving, and he would likely benefit from an ENT consult given that the sac of the sebaceous cyst is still present and he may need additional surgical intervention.  ____________________________________________  FINAL CLINICAL IMPRESSION(S) / ED DIAGNOSES  Final diagnoses:  Facial abscess  Facial cellulitis      NEW MEDICATIONS STARTED DURING THIS VISIT:  New Prescriptions   No medications on file     Loleta Rose, MD 04/22/15 2128

## 2015-04-22 NOTE — ED Notes (Signed)
Pt walked out to waiting room with IV still in arm stated he was leaving hes been here for hours and is still in pain. Pt stated he was leaving. IV removed from pts right arm by this EMT

## 2015-04-22 NOTE — ED Notes (Signed)
Pt. Presents with complaint of potential insect bite to R cheek area. Area is swollen with red discoloration. Pt. Complaint of blurred vision in right eye. States he believes he was bitten on the face yesterday afternoon, with swelling increasing last PM.

## 2015-04-22 NOTE — ED Notes (Signed)
Pt returned to the room,  His mom is with him.  She states that he came to get her because his phone wasn't working.

## 2015-04-22 NOTE — ED Notes (Signed)
Notified by charge tech that pt. Was found to be walking out AMA. Pt. With IV still in arm, removed by charge tech prior to leaving. Dr. Littie DeedsGentry made aware.

## 2015-04-22 NOTE — ED Notes (Signed)
Pt presents with c/o right side facial swelling.   Reports that he has a chronic cyst that became inflammed 2 days ago, he says that he tried to squeeze it out but instead he states that he felt it go into his cheek. Now there is significant swelling and tenderness to right cheek.

## 2015-04-22 NOTE — ED Notes (Signed)
Pt was roomed at [id number].  He was seen by ED tech asking how to get out and stating "I need to get something out of my car."

## 2015-04-22 NOTE — H&P (Signed)
[facility] [place] at [facility]   PATIENT NAME: [name]    MR#:  [id number]  DATE OF BIRTH:  January 11, 1986   DATE OF ADMISSION:  04/22/2015  PRIMARY CARE PHYSICIAN: No PCP Per Patient   REQUESTING/REFERRING PHYSICIAN: York Cerise  CHIEF COMPLAINT:   Chief Complaint  Patient presents with  . Facial Swelling    HISTORY OF PRESENT ILLNESS:  [name]  is a 29 y.o. 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 Describes 2-3 day duration right face swelling. Says lesion started as what he thought was a pimple he attempted to pop it and squeeze out some yellowish purulent discharge originally and had worsening swelling, erythema, pain over the next few days. Describes pain as throbbing/burning 8/10 intensity no worsening or relieving factors. The edema has affected his speech pattern but not swallowing no difficulty breathing States subjective chills denies fevers Emergency department course: I and D performed  PAST MEDICAL HISTORY:   Past Medical History  Diagnosis Date  . Depression   . GERD (gastroesophageal reflux disease)     PAST SURGICAL HISTORY:   Past Surgical History  Procedure Laterality Date  . Thumb surgery    . Abdominal surgery      SOCIAL HISTORY:   History  Substance Use Topics  . Smoking status: Current Every Day Smoker -- 0.00 packs/day for 6 years    Types: Cigarettes  . Smokeless tobacco: Never Used  . Alcohol Use: No    FAMILY HISTORY:   Family History  Problem Relation Age of Onset  . Diabetes Neg Hx     DRUG ALLERGIES:   Allergies  Allergen Reactions  . Tylenol [Acetaminophen] Anaphylaxis, Swelling and Rash    Pt states that the reaction was only mild throat swelling and cotton mouth  . Ibuprofen Hives and Swelling    Mild throat swelling and cotton mouth  . Ultram [Tramadol Hcl] Hives and Swelling    Mild throat swelling and cotton mouth     REVIEW OF SYSTEMS:  REVIEW OF SYSTEMS:  CONSTITUTIONAL: Denies fevers, positive chills, denies fatigue, weakness.  EYES: Denies blurred vision, double vision, or eye pain.  EARS, NOSE, THROAT: Denies tinnitus, ear pain, hearing loss.  RESPIRATORY: denies cough, shortness of breath, wheezing  CARDIOVASCULAR: Denies chest pain, palpitations, edema.  GASTROINTESTINAL: Denies nausea, vomiting, diarrhea, abdominal pain.  GENITOURINARY: Denies dysuria, hematuria.  ENDOCRINE: Denies nocturia or thyroid problems. HEMATOLOGIC AND LYMPHATIC: Denies easy bruising or bleeding.  SKIN: Erythematous/edematous area right face, Denies further rash or lesions.  MUSCULOSKELETAL: Denies pain in neck, back, shoulder, knees, hips, or further arthritic symptoms.  NEUROLOGIC: Denies paralysis, paresthesias.  PSYCHIATRIC: Denies anxiety or depressive symptoms. Otherwise full review of systems performed by me is negative.   MEDICATIONS AT HOME:   Prior to Admission medications   Medication Sig Start Date End Date Taking? Authorizing Provider  ibuprofen (ADVIL,MOTRIN) 200 MG tablet Take 1,400 mg by mouth every 6 (six) hours as needed for headache or mild pain.     Historical Provider, MD      VITAL SIGNS:  Blood pressure 104/65, pulse 82, temperature 98 F (36.7 C), temperature source Oral, resp. rate 20, height  (1.753 m), weight 150 lb (68.04 kg), SpO2 95 %.  PHYSICAL EXAMINATION:  VITAL SIGNS: Filed Vitals:   04/22/15 1901  BP: 104/65  Pulse: 82  Temp: 98 F (36.7 C)  Resp: 20   GENERAL:28 y.o.male  currently in no acute distress.  HEAD: Normocephalic, atraumatic.  EYES: Pupils equal, round, reactive to light. Extraocular muscles intact. No scleral icterus.  MOUTH: Moist mucosal membrane. Dentition intact. No abscess noted.  EAR, NOSE, THROAT: Clear without exudates. No external lesions.  NECK: Supple. No thyromegaly. No nodules. No JVD.  PULMONARY: Clear to ascultation, without  wheeze rails or rhonci. No use of accessory muscles, Good respiratory effort. good air entry bilaterally CHEST: Nontender to palpation.  CARDIOVASCULAR: S1 and S2. Regular rate and rhythm. No murmurs, rubs, or gallops. No edema. Pedal pulses 2+ bilaterally.  GASTROINTESTINAL: Soft, nontender, nondistended. No masses. Positive bowel sounds. No hepatosplenomegaly.  MUSCULOSKELETAL: No swelling, clubbing, or edema. Range of motion full in all extremities.  NEUROLOGIC: Cranial nerves II through XII are intact. No gross focal neurological deficits. Sensation intact. Reflexes intact.  SKIN: Right maxillary area partially golf ball sized area of swelling and erythema warm to touch now status post I&D draining purulent material in the ER, No further ulceration, lesions, rashes, or cyanosis. Skin warm and dry. Turgor intact.  PSYCHIATRIC: Mood, affect within normal limits. The patient is awake, alert and oriented x 3. Insight, judgment intact.    LABORATORY PANEL:   CBC  Recent Labs Lab 04/22/15 1148  WBC 12.6*  HGB 17.0  HCT 47.5  PLT 201   ------------------------------------------------------------------------------------------------------------------  Chemistries   Recent Labs Lab 04/22/15 1148  NA 139  K 3.4*  CL 107  CO2 20*  GLUCOSE 106*  BUN 9  CREATININE 0.92  CALCIUM 9.3   ------------------------------------------------------------------------------------------------------------------  Cardiac Enzymes No results for input(s): TROPONINI in the last 168 hours. ------------------------------------------------------------------------------------------------------------------  RADIOLOGY:  Ct Maxillofacial W/cm  04/22/2015   CLINICAL DATA:  Recent emergency department visit for laceration to right cheek but left against medical advice, refusing treatment. Today, returns with right facial swelling, pain and fever. Possible insect bite.  EXAM: CT MAXILLOFACIAL WITH CONTRAST   TECHNIQUE: Multidetector CT imaging of the maxillofacial structures was performed with intravenous contrast. Multiplanar CT image reconstructions were also generated. A small metallic BB was placed on the right temple in order to reliably differentiate right from left.  CONTRAST:  80mL OMNIPAQUE IOHEXOL 300 MG/ML  SOLN  COMPARISON:  None.  FINDINGS: There is subcutaneous haziness overlying the right maxilla, with a focal collection of peripherally hyper attenuating fluid, deep to the skin surface, measuring approximately 1.6 x 1.8 cm. Left facial soft tissues are unremarkable. No air-fluid levels in the paranasal sinuses. Small bilateral mastoid effusions, left greater than right. Scattered dental caries. At least 1 periapical lucency involving a left maxillary tooth (series 202, image 43 and series 206, image 57).  IMPRESSION: 1. Subcutaneous abscess overlies the right maxilla, with surrounding edema. 2. Small bilateral mastoid effusions, left greater than right. 3. Scattered dental caries and at least 1 periapical abscess involving a left maxillary tooth.   Electronically Signed   By: Leanna Battles M.D.   On: 04/22/2015 13:50    EKG:   Orders placed or performed during the hospital encounter of 03/16/14  . EKG 12-Lead  . EKG 12-Lead  . EKG    IMPRESSION AND PLAN:   29 year old Caucasian gentleman history of IV drug abuse presenting with face swelling  1. Facial cellulitis: Received clindamycin in emergency department, IV antibiotics with vancomycin follow culture data) medication as required 2. Venous thrombus embolism prophylactic: Heparin subcutaneous    All the records are reviewed and case discussed with ED provider. Management plans discussed with the patient,  family and they are in agreement.  CODE STATUS: Full  TOTAL TIME TAKING CARE OF THIS PATIENT: 35 minutes.    Hower,  Mardi MainlandDavid K M.D on 04/22/2015 at 9:42 PM  Between 7am to 6pm - Pager - [phone]  After 6pm: House  Pager: - [phone]  [facility]  Office  [phone]  CC: Primary care physician; No PCP Per Patient

## 2015-04-23 LAB — CBC
HCT: 44.6 % (ref 40.0–52.0)
HCT: 41.8 % (ref 40.0–52.0)
Hemoglobin: 14.7 g/dL (ref 13.0–18.0)
Hemoglobin: 14.4 g/dL (ref 13.0–18.0)
MCH: 31.1 pg (ref 26.0–34.0)
MCH: 32 pg (ref 26.0–34.0)
MCHC: 33.1 g/dL (ref 32.0–36.0)
MCHC: 34.4 g/dL (ref 32.0–36.0)
MCV: 94.2 fL (ref 80.0–100.0)
MCV: 92.9 fL (ref 80.0–100.0)
PLATELETS: 174 10*3/uL (ref 150–440)
Platelets: 177 10*3/uL (ref 150–440)
RBC: 4.73 MIL/uL (ref 4.40–5.90)
RBC: 4.5 MIL/uL (ref 4.40–5.90)
RDW: 13 % (ref 11.5–14.5)
RDW: 12.8 % (ref 11.5–14.5)
WBC: 8.3 10*3/uL (ref 3.8–10.6)
WBC: 7.9 10*3/uL (ref 3.8–10.6)

## 2015-04-23 LAB — BASIC METABOLIC PANEL
Anion gap: 4 — ABNORMAL LOW (ref 5–15)
BUN: 11 mg/dL (ref 6–20)
CO2: 28 mmol/L (ref 22–32)
Calcium: 9.1 mg/dL (ref 8.9–10.3)
Chloride: 107 mmol/L (ref 101–111)
Creatinine, Ser: 0.92 mg/dL (ref 0.61–1.24)
GFR calc Af Amer: >60 mL/min (ref >60)
GFR calc non Af Amer: >60 mL/min (ref >60)
Glucose, Bld: 117 mg/dL — ABNORMAL HIGH (ref 65–99)
Potassium: 3.7 mmol/L (ref 3.5–5.1)
SODIUM: 139 mmol/L (ref 135–145)

## 2015-04-23 LAB — CREATININE, SERUM
CREATININE: 0.81 mg/dL (ref 0.61–1.24)
GFR calc Af Amer: >60 mL/min (ref >60)
GFR calc non Af Amer: >60 mL/min (ref >60)

## 2015-04-23 LAB — MRSA PCR SCREENING: MRSA by PCR: NEGATIVE

## 2015-04-23 MED ORDER — GENTAMICIN SULFATE 0.1 % EX OINT
TOPICAL_OINTMENT | Freq: Three times a day (TID) | CUTANEOUS | Status: DC
Start: 2015-04-23 — End: 2015-04-26
  Administered 2015-04-23: 1 via TOPICAL
  Administered 2015-04-23 – 2015-04-26 (×6): via TOPICAL
  Filled 2015-04-23: qty 15

## 2015-04-23 MED ORDER — DEXAMETHASONE SODIUM PHOSPHATE 10 MG/ML IJ SOLN
10.0000 mg | Freq: Three times a day (TID) | INTRAMUSCULAR | Status: DC
  Administered 2015-04-23 – 2015-04-25 (×6): 10 mg via INTRAVENOUS
  Filled 2015-04-23 (×12): qty 1

## 2015-04-23 MED ORDER — HYDROGEN PEROXIDE 3 % EX SOLN
Freq: Three times a day (TID) | CUTANEOUS | Status: DC
  Administered 2015-04-23 – 2015-04-26 (×7): via TOPICAL
  Filled 2015-04-23: qty 473

## 2015-04-23 MED ORDER — SODIUM CHLORIDE 0.9 % IV SOLN
3.0000 g | Freq: Four times a day (QID) | INTRAVENOUS | Status: DC
  Administered 2015-04-23 – 2015-04-26 (×13): 3 g via INTRAVENOUS
  Filled 2015-04-23 (×19): qty 3

## 2015-04-23 MED ORDER — OXYCODONE HCL 5 MG PO TABS
10.0000 mg | ORAL_TABLET | ORAL | Status: DC | PRN
  Administered 2015-04-23 – 2015-04-26 (×15): 10 mg via ORAL
  Filled 2015-04-23 (×15): qty 2

## 2015-04-23 MED ORDER — VANCOMYCIN HCL IN DEXTROSE 1-5 GM/200ML-% IV SOLN
1000.0000 mg | Freq: Three times a day (TID) | INTRAVENOUS | Status: DC
  Administered 2015-04-23 – 2015-04-25 (×6): 1000 mg via INTRAVENOUS
  Filled 2015-04-23 (×7): qty 200

## 2015-04-23 NOTE — Progress Notes (Signed)
ANTIBIOTIC CONSULT NOTE - INITIAL  Pharmacy Consult for Vancomycin  Indication: facial cellultis/ abscess  Allergies  Allergen Reactions  . Tylenol [Acetaminophen] Anaphylaxis, Swelling and Rash  . Ibuprofen Hives, Swelling and Other (See Comments)    Pt has mild throat swelling.   Marland Kitchen. Ultram [Tramadol Hcl] Hives, Swelling and Other (See Comments)    Pt has mild throat swelling.     Patient Measurements: Height: 5\' 9"  (175.3 cm) Weight: 150 lb (68.04 kg) IBW/kg (Calculated) : 70.7 Adjusted Body Weight:   Vital Signs: Temp: 97.8 F (36.6 C) (06/30 0800) Temp Source: Oral (06/30 0800) BP: 89/54 mmHg (06/30 0800) Pulse Rate: 50 (06/30 0800) Intake/Output from previous day:   Intake/Output from this shift:    Labs:  Recent Labs  04/22/15 1148 04/23/15 0020 04/23/15 0441  WBC 12.6* 7.9 8.3  HGB 17.0 14.7 14.4  PLT 201 177 174  CREATININE 0.92 0.81 0.92   Estimated Creatinine Clearance: 116 mL/min (by C-G formula based on Cr of 0.92). No results for input(s): VANCOTROUGH, VANCOPEAK, VANCORANDOM, GENTTROUGH, GENTPEAK, GENTRANDOM, TOBRATROUGH, TOBRAPEAK, TOBRARND, AMIKACINPEAK, AMIKACINTROU, AMIKACIN in the last 72 hours.   Microbiology: No results found for this or any previous visit (from the past 720 hour(s)).  Medical History: Past Medical History  Diagnosis Date  . Depression   . GERD (gastroesophageal reflux disease)     Medications:  Scheduled:  . ampicillin-sulbactam (UNASYN) IV  3 g Intravenous Q6H  . dexamethasone  10 mg Intravenous 3 times per day  . heparin  5,000 Units Subcutaneous 3 times per day  . nicotine  21 mg Transdermal Daily  . pneumococcal 23 valent vaccine  0.5 mL Intramuscular Tomorrow-1000  . vancomycin  1,000 mg Intravenous Q8H   Assessment: Patient being treated with unasyn and vanocmycin for facial cellulitis and abscess. Patient previously receiving Vancomycin 1 g IV q24 hours. Last dose received @ ~22:00 on 6/29.   PK  parameters:  Kel (hr-1): 0.101 Half-life (hrs): 6.86 Vd (liters): 47.60 (factor used: 0.7 L/kg)  Goal of Therapy:  Vancomycin trough level 15-20 mcg/ml  Plan:   Will start Vancomycin 1 g IV q8 hours. Will order Vancomycin trough level prior to the 19:00 dose on 7/1.   Follow up culture results  Audree Schrecengost D 04/23/2015,10:44 AM

## 2015-04-23 NOTE — Consult Note (Signed)
Josephina ShihHarrell, Azeez [id number] [birth year] Alford Highlandichard Wieting, MD  Reason for Consult: Facial abscess  HPI: 29 yo male with 3-4 day history of right facial swelling and pain.  Seen in ED at [facility] according to patient they thought "I just wanted pain meds" and sent him home.  Facial pain and swelling which he said started as a pimple which he tried to pop got worse.  Came to [facility] ED last night.  CT showed abscess of right cheek area which had I &D done by ED doctor.  According to patient a lot of pus came out.  Since then has been draining slightly and pain/pressure much improved.  I could find no wound culture done at that time.  Has been on Vanc/clinda since then.  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    Allergies:  Allergies  Allergen Reactions  . Tylenol [Acetaminophen] Anaphylaxis, Swelling and Rash  . Ibuprofen Hives, Swelling and Other (See Comments)    Pt has mild throat swelling.   Marland Kitchen. Ultram [Tramadol Hcl] Hives, Swelling and Other (See Comments)    Pt has mild throat swelling.     ROS: Review of systems normal other than 12 systems except per HPI.  PMH:  Past Medical History  Diagnosis Date  . Depression   . GERD (gastroesophageal reflux disease)     FH:  Family History  Problem Relation Age of Onset  . Diabetes Neg Hx     SH:  History   Social History  . Marital Status: Single    Spouse Name: N/A  . Number of Children: N/A  . Years of Education: N/A   Occupational History  . Not on file.   Social History Main Topics  . Smoking status: Current Every Day Smoker -- 0.00 packs/day for 6 years    Types: Cigarettes  . Smokeless tobacco: Never Used  . Alcohol Use: No  . Drug Use: Yes    Special: Marijuana, Cocaine     Comment: "pain pills" herion -clean 14 months  . Sexual Activity: Not on file   Other Topics Concern  . Not on file   Social History Narrative    PSH:  Past Surgical History  Procedure Laterality Date  . Thumb surgery    .  Abdominal surgery      Physical  Exam: Obvious swelling right face which patient says is improved from yesterday.  Not a lot of surrounding erythema or fluctuance.  Open wound approx 1.5 cm still oozing serosanguinous fluid.  CN 2-12 grossly intact and symmetric. EAC/TMs normal BL. Oral cavity, lips, gums, ororpharynx normal with no masses or lesions. Skin warm and dry. Nasal cavity without polyps or purulence. External nose and ears without masses or lesions. EOMI, PERRLA. Neck supple with no masses or lesions. No lymphadenopathy palpated. Thyroid normal with no masses.   A/P: Facial cellulitis/abscess s/p I & D by ED doctor.  Have asked nursing to take culture of fluid coming from the wound and clean with peroxide 3 times daily then apply Gent ointment.  Continue IV vanc until culture returns.  If swelling worsens would recommend repeat CT to see if abscess has reaccumulated.  Re consult me if worsens.  Also spoke with Dr. Hilton SinclairWeiting about adding decadron 10mg  tid for pain control and decrease edema.     Nadyne Gariepy T 04/23/2015 1:00 PM

## 2015-04-23 NOTE — Progress Notes (Signed)
Patient ID: [name], male   DOB: 1986-10-07, 29 y.o.   MRN: [id number] [facility] PROGRESS NOTE  PCP: No PCP Per Patient  HPI/Subjective: Patient having 10 out of 10 pain in the right facial area. Pain radiates toward the ear and into the jaw. He does have some blurry vision out of the right eye. Patient has poor appetite and difficulty with chewing.  Objective: Filed Vitals:   04/22/15 2307  BP: 101/56  Pulse: 54  Temp: 98.2 F (36.8 C)  Resp: 20   No intake or output data in the 24 hours ending 04/23/15 0749 Filed Weights   04/22/15 1901  Weight: 68.04 kg (150 lb)    ROS: Review of Systems  Constitutional: Negative for fever and chills.  Eyes: Positive for blurred vision.  Respiratory: Negative for cough and shortness of breath.   Cardiovascular: Negative for chest pain.  Gastrointestinal: Positive for nausea. Negative for vomiting, abdominal pain, diarrhea and constipation.  Genitourinary: Negative for dysuria.  Musculoskeletal: Negative for joint pain.  Neurological: Negative for dizziness and headaches.   Exam: Physical Exam  Constitutional: He is oriented to person, place, and time.  HENT:  Head: Normocephalic.  Right Ear: No drainage.  Left Ear: No drainage.  Nose: No mucosal edema.  Mouth/Throat: No oropharyngeal exudate or posterior oropharyngeal edema.  Eyes: Conjunctivae, EOM and lids are normal. Pupils are equal, round, and reactive to light.  Vision 20/50 right eye close vision.  Neck: No JVD present. Carotid bruit is not present. No edema present. No thyroid mass and no thyromegaly present.  Cardiovascular: S1 normal and S2 normal.  Exam reveals no gallop.   No murmur heard. Pulses:      Dorsalis pedis pulses are 2+ on the right side, and 2+ on the left side.  Respiratory: No respiratory distress. He has no wheezes. He has no rhonchi. He has no rales.  GI: Soft. Bowel sounds are normal. There is no tenderness.  Musculoskeletal:        Right ankle: He exhibits no swelling.       Left ankle: He exhibits no swelling.  Lymphadenopathy:       Head (right side): Submental, submandibular, preauricular and posterior auricular adenopathy present.    He has cervical adenopathy.       Right cervical: Superficial cervical adenopathy present.  Neurological: He is alert and oriented to person, place, and time. No cranial nerve deficit.  Skin: Skin is warm. Nails show no clubbing.  Right facial swelling and erythema over the right cheek. Dried blood scabbing over where ER physician tried to aspirate.  Psychiatric: He has a normal mood and affect.    Data Reviewed: Basic Metabolic Panel:  Recent Labs Lab 04/22/15 1148 04/23/15 0020 04/23/15 0441  NA 139  --  139  K 3.4*  --  3.7  CL 107  --  107  CO2 20*  --  28  GLUCOSE 106*  --  117*  BUN 9  --  11  CREATININE 0.92 0.81 0.92  CALCIUM 9.3  --  9.1   CBC:  Recent Labs Lab 04/22/15 1148 04/23/15 0020 04/23/15 0441  WBC 12.6* 7.9 8.3  NEUTROABS 10.7*  --   --   HGB 17.0 14.7 14.4  HCT 47.5 44.6 41.8  MCV 89.5 94.2 92.9  PLT 201 177 174    Studies: Ct Maxillofacial W/cm  04/22/2015   CLINICAL DATA:  Recent emergency department visit for laceration to right cheek but  left against medical advice, refusing treatment. Today, returns with right facial swelling, pain and fever. Possible insect bite.  EXAM: CT MAXILLOFACIAL WITH CONTRAST  TECHNIQUE: Multidetector CT imaging of the maxillofacial structures was performed with intravenous contrast. Multiplanar CT image reconstructions were also generated. A small metallic BB was placed on the right temple in order to reliably differentiate right from left.  CONTRAST:  80mL OMNIPAQUE IOHEXOL 300 MG/ML  SOLN  COMPARISON:  None.  FINDINGS: There is subcutaneous haziness overlying the right maxilla, with a focal collection of peripherally hyper attenuating fluid, deep to the skin surface, measuring approximately 1.6 x 1.8 cm.  Left facial soft tissues are unremarkable. No air-fluid levels in the paranasal sinuses. Small bilateral mastoid effusions, left greater than right. Scattered dental caries. At least 1 periapical lucency involving a left maxillary tooth (series 202, image 43 and series 206, image 57).  IMPRESSION: 1. Subcutaneous abscess overlies the right maxilla, with surrounding edema. 2. Small bilateral mastoid effusions, left greater than right. 3. Scattered dental caries and at least 1 periapical abscess involving a left maxillary tooth.   Electronically Signed   By: Leanna BattlesMelinda  Blietz M.D.   On: 04/22/2015 13:50    Scheduled Meds: . heparin  5,000 Units Subcutaneous 3 times per day  . nicotine  21 mg Transdermal Daily  . pneumococcal 23 valent vaccine  0.5 mL Intramuscular Tomorrow-1000  . vancomycin  1,000 mg Intravenous Q24H   Continuous Infusions:   Assessment/Plan:  1. Facial cellulitis and abscess. Since the CT scan also shows a dental abscess and mastoid sinus fluid levels, I will also add IV Unasyn to the IV vancomycin already started. Since the abscess on the right cheek was incompletely aspirated by ER physician, I will get an ENT consultation. I will have the nurse screen for staph on nasal swab. 2. Severe facial pain- when necessary IV and oral medications. 3. Tobacco abuse- smoking cessation counseling 3 minutes by me. When necessary nicotine patch  Code Status:     Code Status Orders        Start     Ordered   04/22/15 2128  Full code   Continuous     04/22/15 2128      Disposition Plan: Home once better  Consultants:  ENT  Time spent: 25 minutes  Alford HighlandWIETING, Drucella Karbowski  [facility]

## 2015-04-23 NOTE — Progress Notes (Signed)
Pt admitted to [facility] from the ED.  Alert and oriented. VSS. Medicated with roxycodone and dilaudid for pain.  No n/v noted.  A strong smoke smell came from pt when he went to ambulate outside of room to talk on the phone. Asked pt if he smoked and he said "No".  Informed pt of facility being nonsmoking. Pt will start nicotine patch tomorrow. Remains with swelling to right side of face. Not weeping at this time.  Pt stated his right eye is blurred.  Mother at bedside. Will cont. To monitor.

## 2015-04-24 LAB — VANCOMYCIN, TROUGH: Vancomycin Tr: 36 ug/mL (ref 10–20)

## 2015-04-24 NOTE — Progress Notes (Addendum)
ANTIBIOTIC CONSULT NOTE - FOLLOW UP  Pharmacy Consult for Vancomycin Indication: facial cellulitis/abscess  Allergies  Allergen Reactions  . Tylenol [Acetaminophen] Anaphylaxis, Swelling and Rash  . Ibuprofen Hives, Swelling and Other (See Comments)    Pt has mild throat swelling.   [name]. Ultram [Tramadol Hcl] Hives, Swelling and Other (See Comments)    Pt has mild throat swelling.     Patient Measurements: Height: 5\' 9"  (175.3 cm) Weight: 150 lb (68.04 kg) IBW/kg (Calculated) : 70.7   Vital Signs: Temp: 98.1 F (36.7 C) (07/01 1632) Temp Source: Oral (07/01 1632) BP: 89/52 mmHg (07/01 1633) Pulse Rate: 54 (07/01 1633) Intake/Output from previous day: 06/30 0701 - 07/01 0700 In: 1354 [P.O.:600; IV Piggyback:754] Out: 400 [Urine:400] Intake/Output from this shift:    Labs:  Recent Labs  04/22/15 1148 04/23/15 0020 04/23/15 0441  WBC 12.6* 7.9 8.3  HGB 17.0 14.7 14.4  PLT 201 177 174  CREATININE 0.92 0.81 0.92   Estimated Creatinine Clearance: 116 mL/min (by C-G formula based on Cr of 0.92).  Recent Labs  04/24/15 1908  VANCOTROUGH 36*     Microbiology: Recent Results (from the past 720 hour(s))  MRSA PCR Screening     Status: None   Collection Time: 04/23/15  8:27 AM  Result Value Ref Range Status   MRSA by PCR NEGATIVE NEGATIVE Final    Comment:        The GeneXpert MRSA Assay (FDA approved for NASAL specimens only), is one component of a comprehensive MRSA colonization surveillance program. It is not intended to diagnose MRSA infection nor to guide or monitor treatment for MRSA infections.   Wound culture     Status: None (Preliminary result)   Collection Time: 04/23/15  4:32 PM  Result Value Ref Range Status   Specimen Description ABSCESS  Final   Special Requests NONE  Final   Gram Stain   Final    RARE WBC SEEN FEW GRAM NEGATIVE RODS RARE GRAM POSITIVE RODS    Culture TOO YOUNG TO READ  Final   Report Status PENDING  Incomplete     Anti-infectives    Start     Dose/Rate Route Frequency Ordered Stop   04/23/15 1100  vancomycin (VANCOCIN) IVPB 1000 mg/200 mL premix     1,000 mg 200 mL/hr over 60 Minutes Intravenous Every 8 hours 04/23/15 1041     04/23/15 0800  Ampicillin-Sulbactam (UNASYN) 3 g in sodium chloride 0.9 % 100 mL IVPB     3 g 100 mL/hr over 60 Minutes Intravenous Every 6 hours 04/23/15 0758     04/22/15 2130  vancomycin (VANCOCIN) IVPB 1000 mg/200 mL premix  Status:  Discontinued     1,000 mg 200 mL/hr over 60 Minutes Intravenous Every 24 hours 04/22/15 2128 04/23/15 1041   04/22/15 2015  clindamycin (CLEOCIN) IVPB 600 mg     600 mg 100 mL/hr over 30 Minutes Intravenous  Once 04/22/15 2011 04/22/15 2120      Assessment: Patient on vancomycin and Unasyn for facial cellulitis and abscess with vancomycin trough elevated. Vancomycin dose was charted before level drawn, therefore level drawn while vancomycin infusing.   Goal of Therapy:  Vancomycin trough level 15-20 mcg/ml  Plan:  Will recheck vancomycin trough with the next dose and instruct nursing not to hang bag until trough drawn.    Luisa HartChristy, Scott D 04/24/2015,8:18 PM    7/2 vanc trough 21. Changed to 1 gram q 12 hours. Level ordered before 3rd new dose.  [place]on Reek, PharmD, BCPS  04/25/2015

## 2015-04-24 NOTE — Progress Notes (Signed)
Patient ID: [name], male   DOB: [birth year], 29 y.o.   MRN: [id number] [facility] PROGRESS NOTE  PCP: No PCP Per Patient  HPI/Subjective: Patient still having quite a bit of pain in the right face going up to the right eye and right ear and down into the jaw. Graded 10 out of 10 in intensity. The wound is starting to drain.  Objective: Filed Vitals:   04/23/15 2320  BP: 122/72  Pulse: 54  Temp: 98.2 F (36.8 C)  Resp: 16    Intake/Output Summary (Last 24 hours) at 04/24/15 0806 Last data filed at 04/24/15 0400  Gross per 24 hour  Intake   1114 ml  Output    400 ml  Net    714 ml   Filed Weights   04/22/15 1901  Weight: 68.04 kg (150 lb)    ROS: Review of Systems  Constitutional: Negative for fever and chills.  Eyes: Positive for blurred vision.  Respiratory: Negative for cough and shortness of breath.   Cardiovascular: Negative for chest pain.  Gastrointestinal: Positive for nausea. Negative for vomiting, abdominal pain, diarrhea and constipation.  Genitourinary: Negative for dysuria.  Musculoskeletal: Negative for joint pain.  Neurological: Negative for dizziness and headaches.   Exam: Physical Exam  Constitutional: He is oriented to person, place, and time.  HENT:  Head: Normocephalic.  Right Ear: No drainage.  Left Ear: No drainage.  Nose: No mucosal edema.  Mouth/Throat: No oropharyngeal exudate or posterior oropharyngeal edema.  Eyes: Conjunctivae, EOM and lids are normal. Pupils are equal, round, and reactive to light.  Vision 20/50 right eye close vision.  Neck: No JVD present. Carotid bruit is not present. No edema present. No thyroid mass and no thyromegaly present.  Cardiovascular: S1 normal and S2 normal.  Exam reveals no gallop.   No murmur heard. Pulses:      Dorsalis pedis pulses are 2+ on the right side, and 2+ on the left side.  Respiratory: No respiratory distress. He has no wheezes. He has no rhonchi. He has no rales.   GI: Soft. Bowel sounds are normal. There is no tenderness.  Musculoskeletal:       Right ankle: He exhibits no swelling.       Left ankle: He exhibits no swelling.  Lymphadenopathy:       Head (right side): Submental, submandibular, preauricular and posterior auricular adenopathy present.    He has cervical adenopathy.       Right cervical: Superficial cervical adenopathy present.  Neurological: He is alert and oriented to person, place, and time. No cranial nerve deficit.  Skin: Skin is warm. Nails show no clubbing.  Right facial swelling is slightly less. Erythema over the right cheek has faded. I was able to squeeze out pus out of his wound. Patient wanted me to stop secondary to pain.  Psychiatric: He has a normal mood and affect.    Data Reviewed: Basic Metabolic Panel:  Recent Labs Lab 04/22/15 1148 04/23/15 0020 04/23/15 0441  NA 139  --  139  K 3.4*  --  3.7  CL 107  --  107  CO2 20*  --  28  GLUCOSE 106*  --  117*  BUN 9  --  11  CREATININE 0.92 0.81 0.92  CALCIUM 9.3  --  9.1   CBC:  Recent Labs Lab 04/22/15 1148 04/23/15 0020 04/23/15 0441  WBC 12.6* 7.9 8.3  NEUTROABS 10.7*  --   --   HGB  17.0 14.7 14.4  HCT 47.5 44.6 41.8  MCV 89.5 94.2 92.9  PLT 201 177 174    Studies: Ct Maxillofacial W/cm  04/22/2015   CLINICAL DATA:  Recent emergency department visit for laceration to right cheek but left against medical advice, refusing treatment. Today, returns with right facial swelling, pain and fever. Possible insect bite.  EXAM: CT MAXILLOFACIAL WITH CONTRAST  TECHNIQUE: Multidetector CT imaging of the maxillofacial structures was performed with intravenous contrast. Multiplanar CT image reconstructions were also generated. A small metallic BB was placed on the right temple in order to reliably differentiate right from left.  CONTRAST:  80mL OMNIPAQUE IOHEXOL 300 MG/ML  SOLN  COMPARISON:  None.  FINDINGS: There is subcutaneous haziness overlying the right  maxilla, with a focal collection of peripherally hyper attenuating fluid, deep to the skin surface, measuring approximately 1.6 x 1.8 cm. Left facial soft tissues are unremarkable. No air-fluid levels in the paranasal sinuses. Small bilateral mastoid effusions, left greater than right. Scattered dental caries. At least 1 periapical lucency involving a left maxillary tooth (series 202, image 43 and series 206, image 57).  IMPRESSION: 1. Subcutaneous abscess overlies the right maxilla, with surrounding edema. 2. Small bilateral mastoid effusions, left greater than right. 3. Scattered dental caries and at least 1 periapical abscess involving a left maxillary tooth.   Electronically Signed   By: Leanna BattlesMelinda  Blietz M.D.   On: 04/22/2015 13:50    Scheduled Meds: . ampicillin-sulbactam (UNASYN) IV  3 g Intravenous Q6H  . dexamethasone  10 mg Intravenous 3 times per day  . gentamicin ointment   Topical TID  . heparin  5,000 Units Subcutaneous 3 times per day  . hydrogen peroxide   Topical TID  . nicotine  21 mg Transdermal Daily  . pneumococcal 23 valent vaccine  0.5 mL Intramuscular Tomorrow-1000  . vancomycin  1,000 mg Intravenous Q8H   Continuous Infusions:   Assessment/Plan:  1. Facial cellulitis and abscess. Since the CT scan also shows a dental abscess and mastoid sinus fluid levels. Continue IV Unasyn, IV vancomycin and IV Decadron. The abscess is starting to drain and I was able to squeeze out some fluid today. Culture pending. The cellulitis part has improved. Will need to be watched on a day-to-day basis to see when to go home. 2. Severe facial pain- when necessary IV and oral medications. 3. Tobacco abuse- smoking cessation counseling 3 minutes by me. When necessary nicotine patch  Code Status:     Code Status Orders        Start     Ordered   04/22/15 2128  Full code   Continuous     04/22/15 2128     Family discussion: Family at bedside. Disposition Plan: Home once  better  Consultants:  ENT  Time spent: 22 minutes  Alford HighlandWIETING, Hadiya Spoerl  [facility]

## 2015-04-25 LAB — PLATELET COUNT: Platelets: 171 10*3/uL (ref 150–440)

## 2015-04-25 LAB — VANCOMYCIN, TROUGH: Vancomycin Tr: 21 ug/mL (ref 10–20)

## 2015-04-25 MED ORDER — NICOTINE 10 MG IN INHA
1.0000 | RESPIRATORY_TRACT | Status: DC | PRN
  Administered 2015-04-25: 1 via RESPIRATORY_TRACT
  Filled 2015-04-25: qty 36

## 2015-04-25 MED ORDER — VANCOMYCIN HCL IN DEXTROSE 1-5 GM/200ML-% IV SOLN
1000.0000 mg | Freq: Two times a day (BID) | INTRAVENOUS | Status: DC
  Administered 2015-04-25 – 2015-04-26 (×2): 1000 mg via INTRAVENOUS
  Filled 2015-04-25 (×5): qty 200

## 2015-04-25 MED ORDER — LORAZEPAM 0.5 MG PO TABS
0.5000 mg | ORAL_TABLET | Freq: Four times a day (QID) | ORAL | Status: DC | PRN
  Administered 2015-04-25: 0.5 mg via ORAL
  Filled 2015-04-25: qty 1

## 2015-04-25 NOTE — Progress Notes (Signed)
[facility] [place] [facility]   PATIENT NAME: Bernard ShihJustin Hamilton    MR#:  [id number]  DATE OF BIRTH:  1986/01/10  SUBJECTIVE:  CHIEF COMPLAINT:   Chief Complaint  Patient presents with  . Facial Swelling   Has continued pain in the right cheek and also new pain in the right side of the throat. Some difficulty swallowing. No difficulty breathing.  REVIEW OF SYSTEMS:   Review of Systems  Constitutional: Positive for chills and diaphoresis. Negative for fever.  HENT: Positive for sore throat. Negative for congestion, ear discharge, ear pain and hearing loss.   Eyes: Negative for blurred vision and pain.  Respiratory: Negative for shortness of breath and stridor.   Cardiovascular: Negative for chest pain and palpitations.  Gastrointestinal: Negative for nausea, vomiting and abdominal pain.  Genitourinary: Negative for dysuria.  Skin: Negative for rash.  Neurological: Negative for headaches.  Psychiatric/Behavioral: The patient is nervous/anxious.     DRUG ALLERGIES:   Allergies  Allergen Reactions  . Tylenol [Acetaminophen] Anaphylaxis, Swelling and Rash  . Ibuprofen Hives, Swelling and Other (See Comments)    Pt has mild throat swelling.   Marland Kitchen. Ultram [Tramadol Hcl] Hives, Swelling and Other (See Comments)    Pt has mild throat swelling.     VITALS:  Blood pressure 104/59, pulse 45, temperature 97.7 F (36.5 C), temperature source Oral, resp. rate 14, height 5\' 9"  (1.753 m), weight 68.04 kg (150 lb), SpO2 99 %.  PHYSICAL EXAMINATION:  GENERAL:  29 y.o.-year-old patient lying in the bed with no acute distress.  EYES: Pupils equal, round, reactive to light and accommodation. No scleral icterus. Extraocular muscles intact.  HEENT: Head atraumatic, normocephalic. Oropharynx and nasopharynx clear. No tonsillar swelling. Mucous membranes are moist NECK:  Supple, no jugular venous distention. No thyroid enlargement, no tenderness. No  lymphadenopathy LUNGS: Normal breath sounds bilaterally, no wheezing, rales,rhonchi or crepitation. No use of accessory muscles of respiration.  CARDIOVASCULAR: S1, S2 normal. No murmurs, rubs, or gallops.  ABDOMEN: Soft, nontender, nondistended. Bowel sounds present. No organomegaly or mass.  EXTREMITIES: No pedal edema, cyanosis, or clubbing.  NEUROLOGIC: Cranial nerves II through XII are intact. Muscle strength 5/5 in all extremities. Sensation intact. Gait not checked.  PSYCHIATRIC: The patient is alert and oriented x 3.  SKIN: Scab over the right cheek, no drainage, there is erythema and induration, no specific collection, erythema over the cheek only does not involve the orbit or the neck   LABORATORY PANEL:   CBC  Recent Labs Lab 04/23/15 0441 04/25/15 0304  WBC 8.3  --   HGB 14.4  --   HCT 41.8  --   PLT 174 171   ------------------------------------------------------------------------------------------------------------------  Chemistries   Recent Labs Lab 04/23/15 0441  NA 139  K 3.7  CL 107  CO2 28  GLUCOSE 117*  BUN 11  CREATININE 0.92  CALCIUM 9.1   ------------------------------------------------------------------------------------------------------------------  Cardiac Enzymes No results for input(s): TROPONINI in the last 168 hours. ------------------------------------------------------------------------------------------------------------------  RADIOLOGY:  No results found.  EKG:   Orders placed or performed during the hospital encounter of 03/16/14  . EKG 12-Lead  . EKG 12-Lead  . EKG    ASSESSMENT AND PLAN:   #1 facial cellulitis and abscess: CT scan showing dental abscess and mastoid sinus fluid levels. He has been seen by ENT. Wound culture is pending, there are gram-negative rods present. He is currently being treated with Unasyn and vancomycin. Also receiving Decadron every 8 hours to  decrease swelling. Also applying gentamicin  ointment. Symptoms do seem to be improving. Patient is anxious for discharge but I would like to see culture results to target oral anti-biotics prior to discharge. He reports right neck pain which is concerning. No lymphadenopathy. No tonsillar enlargement or difficulty swallowing. Continue pain management with IV and oral medications  #2 tobacco abuse: Nicotine patch  #3 disposition: This patient is anxious for discharge. He has been leaving the room without informing his nurse. He is on contact precautions for history of MRSA but has refused to stay in his room despite discussing this with me and with Tommi Rumps RN multiple times. He has been informed that this is for the safety of all of our patients. Anticipate DC in am if he does not leave AMA prior to this.   All the records are reviewed and case discussed with Care Management/Social Workerr. Management plans discussed with the patient, family and they are in agreement.  CODE STATUS: full  TOTAL TIME TAKING CARE OF THIS PATIENT: 45 minutes.   POSSIBLE D/C IN 1 DAYS, DEPENDING ON CLINICAL CONDITION.   Elby Showers M.D on 04/25/2015 at 1:26 PM  Between 7am to 6pm - Pager - [phone]  After 6pm go to [email or web address] - password [email or web address] [facility]  [facility]  Office  [phone]  CC: Primary care physician; No PCP Per Patient

## 2015-04-25 NOTE — Progress Notes (Signed)
Due to patient condition, the patient verbalized understanding that he is on isolation precautions and needs to remain inside of room until further notice by healthcare team. Patient verbalized understanding of importance for remaining inside assigned room until further notice.  Patient understands there is currently no order for leaving the hospital floor. Patient was advised to notify nursing staff if needing assistance.  Dr. Clent RidgesWalsh is also aware and spoke with patient.

## 2015-04-26 MED ORDER — GENTAMICIN SULFATE 0.1 % EX OINT: TOPICAL_OINTMENT | Freq: Three times a day (TID) | CUTANEOUS | Status: DC

## 2015-04-26 MED ORDER — HYDROGEN PEROXIDE 3 % EX SOLN: Freq: Three times a day (TID) | CUTANEOUS | Status: DC

## 2015-04-26 MED ORDER — HYDROCODONE-IBUPROFEN 5-200 MG PO TABS: 1.0000 | ORAL_TABLET | Freq: Four times a day (QID) | ORAL | Status: DC | PRN

## 2015-04-26 MED ORDER — CLINDAMYCIN HCL 300 MG PO CAPS: 300.0000 mg | ORAL_CAPSULE | Freq: Three times a day (TID) | ORAL | Status: AC

## 2015-04-26 NOTE — Discharge Instructions (Signed)
Notify your doctor if your symptoms worsen or if you began to have difficulty breathing.  Take all medications as prescribed; take your antibiotics until complete even if you are feeling better.  Drink plenty of fluids.  *Notify your doctor for increasing pain unrelieved with the prescribed pain medications.  *Notify your doctor with any questions or concerns.   DIET:  Regular diet  DISCHARGE CONDITION:  Stable  ACTIVITY:  Activity as tolerated  OXYGEN:  Home Oxygen: No.   Oxygen Delivery: room air  DISCHARGE LOCATION:  home   If you experience worsening of your admission symptoms, develop shortness of breath, life threatening emergency, suicidal or homicidal thoughts you must seek medical attention immediately by calling 911 or calling your MD immediately  if symptoms less severe.  You Must read complete instructions/literature along with all the possible adverse reactions/side effects for all the Medicines you take and that have been prescribed to you. Take any new Medicines after you have completely understood and accpet all the possible adverse reactions/side effects.   Please note  You were cared for by a hospitalist during your hospital stay. If you have any questions about your discharge medications or the care you received while you were in the hospital after you are discharged, you can call the unit and asked to speak with the hospitalist on call if the hospitalist that took care of you is not available. Once you are discharged, your primary care physician will handle any further medical issues. Please note that NO REFILLS for any discharge medications will be authorized once you are discharged, as it is imperative that you return to your primary care physician (or establish a relationship with a primary care physician if you do not have one) for your aftercare needs so that they can reassess your need for medications and monitor your lab values.

## 2015-04-26 NOTE — Progress Notes (Signed)
[facility]         [place]   04/26/2015  Patient: Bernard ShihJustin Gurevich   Date of Birth:  1986/07/14  Date of admission:  04/22/2015  Date of Discharge  04/26/2015    To Whom it May Concern:   Bernard ShihJustin Rachels  may return to work on 04/26/2015.  WORK-EMPLOYMENT:  Full Duty  If you have any questions or concerns, please don't hesitate to call.  Sincerely,   Elby ShowersWALSH, CATHERINE M.D Office : [phone]   .

## 2015-04-26 NOTE — Discharge Summary (Signed)
[facility] [place] [facility]  DISCHARGE SUMMARY   PATIENT NAME: [name]    MR#:  [id number]  DATE OF BIRTH:  04-18-1986  DATE OF ADMISSION:  04/22/2015 ADMITTING PHYSICIAN: Wyatt Haste, MD  DATE OF DISCHARGE: 04/26/2015  PRIMARY CARE PHYSICIAN: No PCP Per Patient    ADMISSION DIAGNOSIS:  Facial abscess [L02.01] Facial cellulitis [L03.211]  DISCHARGE DIAGNOSIS:  Active Problems:   Facial cellulitis   SECONDARY DIAGNOSIS:   Past Medical History  Diagnosis Date  . Depression   . GERD (gastroesophageal reflux disease)     HOSPITAL COURSE:    #1 facial cellulitis and abscess: CT scan showing dental abscess and mastoid sinus fluid levels. He has been seen by ENT. Wound culture is preliminarily of Staphylococcus, sensitivities are pending. Blood cultures negative to date. 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 He was treated with Unasyn and vancomycin during the hospitalization and will be discharged on clindamycin. He was asked to give a valid phone number so that if culture results require change in Tobi Bastos biotics we can reach him tomorrow. He will continue with gentamicin ointment. He will continue with Vicoprofen for pain control. Symptoms seem to be improving at the time of discharge with less induration over the cheek. He does report some sensitivity in the area of the maxillary sinus and a sore throat. He is advised to return if he has any difficulty swallowing or soreness with opening the jaw. He was offered repeat CT scan this morning and declined. He was very anxious for discharge.  #2 tobacco abuse: Smoking cessation counseling provided daily throughout the admission. Patient not interested in quitting smoking. Left his room frequently to smoke outside.  DISCHARGE CONDITIONS:   Stable  CONSULTS OBTAINED:  Treatment Team:  Linus Salmons, MD  DRUG ALLERGIES:    Allergies  Allergen Reactions  . Tylenol [Acetaminophen] Anaphylaxis, Swelling and Rash  . Ibuprofen Hives, Swelling and Other (See Comments)    Pt has mild throat swelling.   Marland Kitchen Ultram [Tramadol Hcl] Hives, Swelling and Other (See Comments)    Pt has mild throat swelling.     DISCHARGE MEDICATIONS:   Current Discharge Medication List    START taking these medications   Details  clindamycin (CLEOCIN) 300 MG capsule Take 1 capsule (300 mg total) by mouth 3 (three) times daily. Qty: 30 capsule, Refills: 0    gentamicin ointment (GARAMYCIN) 0.1 % Apply topically 3 (three) times daily. Qty: 15 g, Refills: 0    hydrocodone-ibuprofen (VICOPROFEN) 5-200 MG per tablet Take 1 tablet by mouth every 6 (six) hours as needed for pain. Qty: 30 tablet, Refills: 0    hydrogen peroxide 3 % external solution Apply topically 3 (three) times daily. Qty: 120 mL, Refills: 0      CONTINUE these medications which have NOT CHANGED   Details  ranitidine (ZANTAC) 300 MG tablet Take 300 mg by mouth 2 (two) times daily.      STOP taking these medications     ibuprofen (ADVIL,MOTRIN) 200 MG tablet      ibuprofen (ADVIL,MOTRIN) 200 MG tablet          DISCHARGE INSTRUCTIONS:   Notify your doctor if your symptoms worsen or if you began to have difficulty breathing.  Take all medications as prescribed; take your antibiotics until complete even if you are feeling better.  Drink plenty of fluids.  *Notify your doctor for  increasing pain unrelieved with the prescribed pain medications.  *Notify your doctor with any questions or concerns.   DIET:  Regular diet  DISCHARGE CONDITION:  Stable  ACTIVITY:  Activity as tolerated  OXYGEN:  Home Oxygen: No.   Oxygen Delivery: room air  DISCHARGE LOCATION:  home   If you experience worsening of your admission symptoms, develop shortness of breath, life threatening emergency, suicidal or homicidal thoughts you must seek medical attention  immediately by calling 911 or calling your MD immediately  if symptoms less severe.  You Must read complete instructions/literature along with all the possible adverse reactions/side effects for all the Medicines you take and that have been prescribed to you. Take any new Medicines after you have completely understood and accpet all the possible adverse reactions/side effects.   Please note  You were cared for by a hospitalist during your hospital stay. If you have any questions about your discharge medications or the care you received while you were in the hospital after you are discharged, you can call the unit and asked to speak with the hospitalist on call if the hospitalist that took care of you is not available. Once you are discharged, your primary care physician will handle any further medical issues. Please note that NO REFILLS for any discharge medications will be authorized once you are discharged, as it is imperative that you return to your primary care physician (or establish a relationship with a primary care physician if you do not have one) for your aftercare needs so that they can reassess your need for medications and monitor your lab values.  Today   CHIEF COMPLAINT:   Chief Complaint  Patient presents with  . Facial Swelling    HISTORY OF PRESENT ILLNESS:  [name] is a 29 y.o. 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 Describes 2-3 day duration right face swelling. Says lesion started as what he thought was a pimple he attempted to pop it and squeeze out some yellowish purulent discharge originally and had worsening swelling, erythema, pain over the next few days. Describes pain as throbbing/burning 8/10 intensity no worsening or relieving factors. The edema has affected his speech pattern but not swallowing no difficulty breathing States subjective chills denies fevers Emergency department course: I and D  performed  VITAL SIGNS:  Blood pressure 111/69, pulse 40, temperature 97.7 F (36.5 C), temperature source Oral, resp. rate 16, height  (1.753 m), weight 68.04 kg (150 lb), SpO2 98 %.  I/O:   Intake/Output Summary (Last 24 hours) at 04/26/15 1211 Last data filed at 04/26/15 0757  Gross per 24 hour  Intake    912 ml  Output      0 ml  Net    912 ml    PHYSICAL EXAMINATION:  GENERAL:  29 y.o.-year-old patient lying in the bed with no acute distress.  EYES: Pupils equal, round, reactive to light and accommodation. No scleral icterus. Extraocular muscles intact.  HEENT: Head atraumatic, normocephalic. Oropharynx and nasopharynx clear.  NECK:  Supple, no jugular venous distention. No thyroid enlargement, no tenderness.  LUNGS: Normal breath sounds bilaterally, no wheezing, rales,rhonchi or crepitation. No use of accessory muscles of respiration.  CARDIOVASCULAR: S1, S2 normal. No murmurs, rubs, or gallops.  ABDOMEN: Soft, non-tender, non-distended. Bowel sounds present. No organomegaly or mass.  EXTREMITIES: No pedal edema, cyanosis, or clubbing.  NEUROLOGIC: Cranial nerves II through XII are intact. Muscle strength 5/5 in all  extremities. Sensation intact. Gait not checked.  PSYCHIATRIC: The patient is alert and oriented x 3.  SKIN: 1 cm long surgical incision over the right cheek with minimal surrounding erythema, no induration, minimal tenderness, no drainage. No cervical lymphadenopathy. No tonsillar enlargement or swelling in the mouth.  DATA REVIEW:   CBC  Recent Labs Lab 04/23/15 0441 04/25/15 0304  WBC 8.3  --   HGB 14.4  --   HCT 41.8  --   PLT 174 171    Chemistries   Recent Labs Lab 04/23/15 0441  NA 139  K 3.7  CL 107  CO2 28  GLUCOSE 117*  BUN 11  CREATININE 0.92  CALCIUM 9.1    Cardiac Enzymes No results for input(s): TROPONINI in the last 168 hours.  Microbiology Results  Results for orders placed or performed during the hospital encounter  of 04/22/15  Blood culture (routine x 2)     Status: None (Preliminary result)   Collection Time: 04/22/15  8:34 PM  Result Value Ref Range Status   Specimen Description BLOOD  Final   Special Requests Normal  Final   Culture NO GROWTH 4 DAYS  Final   Report Status PENDING  Incomplete  Blood culture (routine x 2)     Status: None (Preliminary result)   Collection Time: 04/22/15  8:34 PM  Result Value Ref Range Status   Specimen Description BLOOD  Final   Special Requests Normal  Final   Culture NO GROWTH 4 DAYS  Final   Report Status PENDING  Incomplete  MRSA PCR Screening     Status: None   Collection Time: 04/23/15  8:27 AM  Result Value Ref Range Status   MRSA by PCR NEGATIVE NEGATIVE Final    Comment:        The GeneXpert MRSA Assay (FDA approved for NASAL specimens only), is one component of a comprehensive MRSA colonization surveillance program. It is not intended to diagnose MRSA infection nor to guide or monitor treatment for MRSA infections.   Wound culture     Status: None (Preliminary result)   Collection Time: 04/23/15  4:32 PM  Result Value Ref Range Status   Specimen Description ABSCESS  Final   Special Requests NONE  Final   Gram Stain   Final    RARE WBC SEEN FEW GRAM NEGATIVE RODS RARE GRAM POSITIVE RODS    Culture   Final    LIGHT GROWTH STAPHYLOCOCCUS AUREUS SUSCEPTIBILITIES TO FOLLOW    Report Status PENDING  Incomplete    RADIOLOGY:  No results found.  EKG:   Orders placed or performed during the hospital encounter of 03/16/14  . EKG 12-Lead  . EKG 12-Lead  . EKG      Management plans discussed with the patient, family and they are in agreement.  CODE STATUS:     Code Status Orders        Start     Ordered   04/22/15 2128  Full code   Continuous     04/22/15 2128      TOTAL TIME TAKING CARE OF THIS PATIENT: 35 minutes.    Elby ShowersWALSH, CATHERINE M.D on 04/26/2015 at 12:11 PM  Between 7am to 6pm - Pager - [phone]  After  6pm go to [email or web address] - password [email or web address]  [facility]  Office  [phone]  CC: Primary care physician; No PCP Per Patient

## 2015-04-26 NOTE — Progress Notes (Addendum)
Pt A and O x 4. VSS. Pt tolerating diet well. Complaints of pain with meds given to control. Pt has wound on right cheek where I and D was done. Pt had IV removed and prescriptions given. Pt given discharge instructions and pt voiced that he understood with no other questions. Pt given oral pain meds and told to wait 1 hr before discharge pt left 10 minutes prior to the 1 hr mark. Pt told to call out for wheelchair. Pt did not call and walked out on his own.

## 2015-04-27 LAB — CULTURE, BLOOD (ROUTINE X 2)
CULTURE: NO GROWTH
Culture: NO GROWTH
Special Requests: NORMAL
Special Requests: NORMAL

## 2015-04-27 LAB — WOUND CULTURE

## 2016-08-23 LAB — CULTURE, BLOOD (ROUTINE X 2): SETUP TIME: 201505291118

## 2018-08-01 ENCOUNTER — Ambulatory Visit: Payer: Self-pay | Admitting: Emergency Medicine

## 2019-09-24 ENCOUNTER — Other Ambulatory Visit: Payer: Self-pay

## 2019-09-24 ENCOUNTER — Emergency Department
Admission: EM | Admit: 2019-09-24 | Discharge: 2019-09-24 | Disposition: A | Discharge status: Home / Self Care | Payer: Self-pay | Attending: Student in an Organized Health Care Education/Training Program | Admitting: Student in an Organized Health Care Education/Training Program

## 2019-09-24 ENCOUNTER — Encounter: Payer: Self-pay | Admitting: Emergency Medicine

## 2019-09-24 DIAGNOSIS — F17210 Nicotine dependence, cigarettes, uncomplicated: Secondary | ICD-10-CM | POA: Insufficient documentation

## 2019-09-24 DIAGNOSIS — K047 Periapical abscess without sinus: Secondary | ICD-10-CM | POA: Insufficient documentation

## 2019-09-24 MED ORDER — AMOXICILLIN 500 MG PO CAPS: 500.0000 mg | ORAL_CAPSULE | Freq: Three times a day (TID) | ORAL | 0 refills | Status: AC

## 2019-09-24 MED ORDER — CEFTRIAXONE SODIUM 1 G IJ SOLR
1.0000 g | Freq: Once | INTRAMUSCULAR | Status: AC
  Administered 2019-09-24: 1 g via INTRAMUSCULAR
  Filled 2019-09-24: qty 10

## 2019-09-24 MED ORDER — OXYCODONE HCL 5 MG PO TABS
5.0000 mg | ORAL_TABLET | Freq: Once | ORAL | Status: AC
  Administered 2019-09-24: 5 mg via ORAL
  Filled 2019-09-24: qty 1

## 2019-09-24 MED ORDER — LIDOCAINE HCL (PF) 1 % IJ SOLN
5.0000 mL | Freq: Once | INTRAMUSCULAR | Status: AC
  Administered 2019-09-24: 5 mL via INTRADERMAL
  Filled 2019-09-24: qty 5

## 2019-09-24 MED ORDER — OXYCODONE HCL 5 MG PO TABS: 5.0000 mg | ORAL_TABLET | ORAL | 0 refills | Status: AC | PRN

## 2019-09-24 NOTE — ED Triage Notes (Signed)
Pt reports about a month ago he was injured at work and it broke two of his teeth on the right lower jaw. Pt reports has not been to the MD about it but now the area is swollen and painful and it started when he ate last pm.

## 2019-09-24 NOTE — Discharge Instructions (Signed)
OPTIONS FOR DENTAL FOLLOW UP CARE ° °[place] Department of Health and Human Services - Local Safety Net Dental Clinics °http://www.ncdhhs.gov/dph/oralhealth/services/safetynetclinics.htm °  °[facility] (336-562-3123) ° °[facility] [place] ([phone]) ° °[facility] ([phone] ext [phone]) ° °[facility] ([phone]) ° °[facility] ([phone]) °This clinic caters to the indigent population and is on a lottery system. °Location: °[facility], [address], [place] °Clinic Hours: °Wednesdays from 6pm - 9pm, patients seen by a lottery system. °For dates, call or go to [email or web address].[facility].edu/[facility]/patients/Dental-[facility] °Services: °Cleanings, fillings and simple extractions. °Payment Options: °DENTAL WORK IS FREE OF CHARGE. Bring proof of income or support. °Best way to get seen: °Arrive at 5:15 pm - this is a lottery, NOT first come/first serve, so arriving earlier will not increase your chances of being seen. °  °  °[facility] °[phone] °Select option 1 for emergencies °  °Location: °[facility], [address], [place] °Clinic Hours: °No walk-ins accepted - call the day before to schedule an appointment. °Check in times are 9:30 am and 1:30 pm. °Services: °Simple extractions, temporary fillings, pulpectomy/pulp debridement, uncomplicated abscess drainage. °Payment Options: °PAYMENT IS DUE AT THE TIME OF SERVICE.  Fee is usually $100-200, additional surgical procedures (e.g. abscess drainage) may be extra. °Cash, checks, Visa/MasterCard accepted.  Can file Medicaid if patient is covered for dental - patient should call case worker to check. °No discount for [facility] patients. °Best way to get seen: °MUST call the day before and get onto the schedule. Can usually be seen the next 1-2 days. No walk-ins accepted. °  °  °[facility] °[phone] °   °Location: °[facility], [address], [place] °Clinic Hours: °M, W, Th, F 8am or 1:30pm, Tues 9a or 1:30 - first come/first served. °Services: °Simple extractions, temporary fillings, uncomplicated abscess drainage.  You do not need to be an [place] resident. °Payment Options: °PAYMENT IS DUE AT THE TIME OF SERVICE. °Dental insurance, otherwise sliding scale - bring proof of income or support. °Depending on income and treatment needed, cost is usually $50-200. °Best way to get seen: °Arrive early as it is first come/first served. °  °  °[facility] °[phone] °  °Location: °[address] °Clinic Hours: °Mon-Thu 8a-5p °Services: °Most basic dental services including extractions and fillings. °Payment Options: °PAYMENT IS DUE AT THE TIME OF SERVICE. °Sliding scale, up to 50% off - bring proof if income or support. °Medicaid with dental option accepted. °Best way to get seen: °Call to schedule an appointment, can usually be seen within 2 weeks OR they will try to see walk-ins - show up at 8a or 2p (you may have to wait). °  °  °[facility] °[phone] °[place] RESIDENTS ONLY °  °Location: °[facility], [address], [place], [place] [address] °Clinic Hours: By appointment only. °Monday - Thursday 8am-5pm, Friday 8am-12pm °Services: Cleanings, fillings, extractions. °Payment Options: °PAYMENT IS DUE AT THE TIME OF SERVICE. °Cash, Visa or MasterCard. Sliding scale - $30 minimum per service. °Best way to get seen: °Come in to office, complete packet and make an appointment - need proof of income °or support monies for each household member and proof of [place] residence. °Usually takes about a month to get in. °  °  °[facility] °[phone] °  °Location: °[address]   [place] °Clinic Hours: Walk-in Urgent Care Dental Services are offered Monday-Friday  mornings only. °The numbers of emergencies accepted daily is limited to the number of °providers available. °Maximum 15 - Mondays, Wednesdays & Thursdays °Maximum 10 - Tuesdays & Fridays °Services: °You do not need to be a [place] resident to be seen for a dental emergency. °Emergencies are defined as pain, swelling, abnormal bleeding, or dental trauma. Walkins will receive x-rays if needed. °NOTE: Dental cleaning is not an emergency. °Payment Options: °PAYMENT IS DUE AT THE TIME OF SERVICE. °Minimum co-pay is $40.00 for uninsured patients. °Minimum co-pay is $3.00 for Medicaid with dental coverage. °Dental Insurance is accepted and must be presented at time of visit. °Medicare does not cover dental. °Forms of payment: Cash, credit card, checks. °Best way to get seen: °If not previously registered with the clinic, walk-in dental registration begins at 7:15 am and is on a first come/first serve basis. °If previously registered with the clinic, call to make an appointment. °  °  °[facility] °[phone] °[place] RESIDENTS ONLY °  °Location: °[address], [place], [place] °Clinic Hours: °Mon-Thu 10a-2p °Services: Extractions only! °Payment Options: °FREE (donations accepted) - bring proof of income or support °Best way to get seen: °Call and schedule an appointment OR come at 8am on the 1st Monday of every month (except for holidays) when it is first come/first served. °  °  °[facility] °[phone] °  °Location: °[address], [place] °Clinic Hours: °Friday mornings °Services, Payment Options, Best way to get seen: °Call for info °

## 2019-09-24 NOTE — ED Provider Notes (Signed)
[facility] Emergency Department Provider Note  ____________________________________________   First MD Initiated Contact with Patient 09/24/19 2017     (approximate)  I have reviewed the triage vital signs and the nursing notes.   HISTORY  Chief Complaint Dental Injury, Dental Pain, and Oral Swelling    HPI [name] is a 33 y.o. male presents emergency department complaining of dental abscess.  Right side of his jaw swollen and painful.  He denies any fever or chills.  He did break a tooth off several weeks ago.  No recent dental care.    Past Medical History:  Diagnosis Date  . Depression   . GERD (gastroesophageal reflux disease)     Patient Active Problem List   Diagnosis Date Noted  . Facial cellulitis 04/22/2015    Past Surgical History:  Procedure Laterality Date  . ABDOMINAL SURGERY    . thumb surgery      Prior to Admission medications   Medication Sig Start Date End Date Taking? Authorizing Provider  amoxicillin (AMOXIL) 500 MG capsule Take 1 capsule (500 mg total) by mouth 3 (three) times daily. 09/24/19   Ralynn San, Linden Dolin, PA-C  oxyCODONE (OXY IR/ROXICODONE) 5 MG immediate release tablet Take 1 tablet (5 mg total) by mouth every 4 (four) hours as needed for severe pain. 09/24/19   Tacari Repass, Linden Dolin, PA-C  ranitidine (ZANTAC) 300 MG tablet Take 300 mg by mouth 2 (two) times daily.  09/24/19  [provider]    Allergies Tylenol [acetaminophen], Ibuprofen, and Ultram [tramadol hcl]  Family History  Problem Relation Age of Onset  . Diabetes Neg Hx     Social History Social History   Tobacco Use  . Smoking status: Current Every Day Smoker    Packs/day: 0.00    Years: 6.00    Pack years: 0.00    Types: Cigarettes  . Smokeless tobacco: Never Used  Substance Use Topics  . Alcohol use: No  . Drug use: Yes    Types: Marijuana, Cocaine    Comment: "pain pills" herion -clean 14 months    Review of Systems   Constitutional: No fever/chills Eyes: No visual changes. ENT: No sore throat.  Positive for dental pain and facial swelling Respiratory: Denies cough Genitourinary: Negative for dysuria. Musculoskeletal: Negative for back pain. Skin: Negative for rash.    ____________________________________________   PHYSICAL EXAM:  VITAL SIGNS: ED Triage Vitals  Enc Vitals Group     BP 09/24/19 1901 114/76     Pulse Rate 09/24/19 1901 78     Resp 09/24/19 1901 20     Temp 09/24/19 1901 98.2 F (36.8 C)     Temp Source 09/24/19 1901 Oral     SpO2 09/24/19 1901 100 %     Weight 09/24/19 1859 150 lb (68 kg)     Height 09/24/19 1859 5\' 9"  (1.753 m)     Head Circumference --      Peak Flow --      Pain Score 09/24/19 1858 7     Pain Loc --      Pain Edu? --      Excl. in [place]? --     Constitutional: Alert and oriented. Well appearing and in no acute distress. Eyes: Conjunctivae are normal.  Head: Atraumatic.  Right sided lower jaw swelling Nose: No congestion/rhinnorhea. Mouth/Throat: Mucous membranes are moist.  Poor dentition noted, multiple cavities noted on the molars, swelling is noted at the right lower gum.  No drainable abscess noted Neck:  supple no lymphadenopathy noted Cardiovascular: Normal rate, regular rhythm. Heart sounds are normal Respiratory: Normal respiratory effort.  No retractions, lungs c t a  GU: deferred Musculoskeletal: FROM all extremities, warm and well perfused Neurologic:  Normal speech and language.  Skin:  Skin is warm, dry and intact. No rash noted. Psychiatric: Mood and affect are normal. Speech and behavior are normal.  ____________________________________________   LABS (all labs ordered are listed, but only abnormal results are displayed)  Labs Reviewed - No data to display ____________________________________________   ____________________________________________  RADIOLOGY    ____________________________________________   PROCEDURES   Procedure(s) performed: Rocephin 1 g IM, oxycodone 1 p.o.  Procedures    ____________________________________________   INITIAL IMPRESSION / ASSESSMENT AND PLAN / ED COURSE  Pertinent labs & imaging results that were available during my care of the patient were reviewed by me and considered in my medical decision making (see chart for details).   Patient is 33 year old male presents emergency department with complaints of dental pain and swelling to the right side of his jaw.  Physical exam shows swelling to the right side of the jaw along with a large amount of poor dentition.  Explained findings to the patient.  Due to the mouth swelling he was given Rocephin 1 g IM.  He was given a prescription for amoxicillin.  He is to follow-up with his regular dentist or one of the discounted dental clinics provided for him on his discharge papers.  He was given a prescription for oxycodone and I only gave him 8 pills.  He was discharged in stable condition.    [name] was evaluated in Emergency Department on 09/24/2019 for the symptoms described in the history of present illness. He was evaluated in the context of the global COVID-19 pandemic, which necessitated consideration that the patient might be at risk for infection with the SARS-CoV-2 virus that causes COVID-19. Institutional protocols and algorithms that pertain to the evaluation of patients at risk for COVID-19 are in a state of rapid change based on information released by regulatory bodies including the CDC and federal and state organizations. These policies and algorithms were followed during the patient's care in the ED.   As part of my medical decision making, I reviewed the following data within the electronic MEDICAL RECORD NUMBER Nursing notes reviewed and incorporated, Old chart reviewed, Notes from prior ED visits and [place] Controlled Substance Database  ____________________________________________   FINAL CLINICAL  IMPRESSION(S) / ED DIAGNOSES  Final diagnoses:  Dental abscess      NEW MEDICATIONS STARTED DURING THIS VISIT:  Discharge Medication List as of 09/24/2019  8:38 PM    START taking these medications   Details  amoxicillin (AMOXIL) 500 MG capsule Take 1 capsule (500 mg total) by mouth 3 (three) times daily., Starting Tue 09/24/2019, Normal    oxyCODONE (OXY IR/ROXICODONE) 5 MG immediate release tablet Take 1 tablet (5 mg total) by mouth every 4 (four) hours as needed for severe pain., Starting Tue 09/24/2019, Normal         Note:  This document was prepared using Dragon voice recognition software and may include unintentional dictation errors.    Faythe Ghee, PA-C 09/24/19 2121    Willy Eddy, MD 09/24/19 2122

## 2020-01-16 ENCOUNTER — Emergency Department (HOSPITAL_COMMUNITY)
Admission: EM | Admit: 2020-01-16 | Discharge: 2020-01-17 | Disposition: A | Discharge status: Home / Self Care | Payer: Self-pay | Attending: Emergency Medicine | Admitting: Emergency Medicine

## 2020-01-16 ENCOUNTER — Encounter (HOSPITAL_COMMUNITY): Payer: Self-pay

## 2020-01-16 ENCOUNTER — Other Ambulatory Visit: Payer: Self-pay

## 2020-01-16 DIAGNOSIS — F17210 Nicotine dependence, cigarettes, uncomplicated: Secondary | ICD-10-CM | POA: Insufficient documentation

## 2020-01-16 DIAGNOSIS — M25512 Pain in left shoulder: Secondary | ICD-10-CM

## 2020-01-16 DIAGNOSIS — F1510 Other stimulant abuse, uncomplicated: Secondary | ICD-10-CM

## 2020-01-16 DIAGNOSIS — K625 Hemorrhage of anus and rectum: Secondary | ICD-10-CM

## 2020-01-16 DIAGNOSIS — R519 Headache, unspecified: Secondary | ICD-10-CM

## 2020-01-16 DIAGNOSIS — M25552 Pain in left hip: Secondary | ICD-10-CM

## 2020-01-16 NOTE — ED Triage Notes (Signed)
BIB EMS Pt reports eating Meth and reports headache. Alerted mental status. GCS of 9 with  EMS. Pt reports rectal bleeding.  Swelling noted left shoulder and pain to area.  Pt also reports abd pain.

## 2020-01-17 ENCOUNTER — Emergency Department (HOSPITAL_COMMUNITY): Payer: Self-pay

## 2020-01-17 ENCOUNTER — Encounter (HOSPITAL_COMMUNITY): Payer: Self-pay

## 2020-01-17 LAB — CBC WITH DIFFERENTIAL/PLATELET
Abs Immature Granulocytes: 0.05 10*3/uL (ref 0.00–0.07)
Basophils Absolute: 0 10*3/uL (ref 0.0–0.1)
Basophils Relative: 1 %
Eosinophils Absolute: 0 10*3/uL (ref 0.0–0.5)
Eosinophils Relative: 0 %
HCT: 41.4 % (ref 39.0–52.0)
Hemoglobin: 14.3 g/dL (ref 13.0–17.0)
Immature Granulocytes: 1 %
Lymphocytes Relative: 23 %
Lymphs Abs: 1.4 10*3/uL (ref 0.7–4.0)
MCH: 31 pg (ref 26.0–34.0)
MCHC: 34.5 g/dL (ref 30.0–36.0)
MCV: 89.6 fL (ref 80.0–100.0)
Monocytes Absolute: 0.4 10*3/uL (ref 0.1–1.0)
Monocytes Relative: 6 %
Neutro Abs: 4.2 10*3/uL (ref 1.7–7.7)
Neutrophils Relative %: 69 %
Platelets: 224 10*3/uL (ref 150–400)
RBC: 4.62 MIL/uL (ref 4.22–5.81)
RDW: 12.2 % (ref 11.5–15.5)
WBC: 6 10*3/uL (ref 4.0–10.5)
nRBC: 0 % (ref 0.0–0.2)

## 2020-01-17 LAB — COMPREHENSIVE METABOLIC PANEL
ALT: 41 U/L (ref 0–44)
AST: 32 U/L (ref 15–41)
Albumin: 4.6 g/dL (ref 3.5–5.0)
Alkaline Phosphatase: 78 U/L (ref 38–126)
Anion gap: 9 (ref 5–15)
BUN: 15 mg/dL (ref 6–20)
CO2: 24 mmol/L (ref 22–32)
Calcium: 9.4 mg/dL (ref 8.9–10.3)
Chloride: 107 mmol/L (ref 98–111)
Creatinine, Ser: 0.62 mg/dL (ref 0.61–1.24)
GFR calc Af Amer: >60 mL/min (ref >60)
GFR calc non Af Amer: >60 mL/min (ref >60)
Glucose, Bld: 103 mg/dL — ABNORMAL HIGH (ref 70–99)
Potassium: 3.8 mmol/L (ref 3.5–5.1)
Sodium: 140 mmol/L (ref 135–145)
Total Bilirubin: 0.9 mg/dL (ref 0.3–1.2)
Total Protein: 7.1 g/dL (ref 6.5–8.1)

## 2020-01-17 LAB — PROTIME-INR
INR: 1.1 (ref 0.8–1.2)
Prothrombin Time: 14.1 seconds (ref 11.4–15.2)

## 2020-01-17 LAB — URINALYSIS, ROUTINE W REFLEX MICROSCOPIC
Bilirubin Urine: NEGATIVE
Glucose, UA: NEGATIVE mg/dL
Hgb urine dipstick: NEGATIVE
Ketones, ur: NEGATIVE mg/dL
Leukocytes,Ua: NEGATIVE
Nitrite: NEGATIVE
Protein, ur: NEGATIVE mg/dL
Specific Gravity, Urine: 1.045 — ABNORMAL HIGH (ref 1.005–1.030)
pH: 6 (ref 5.0–8.0)

## 2020-01-17 LAB — ACETAMINOPHEN LEVEL: Acetaminophen (Tylenol), Serum: <10 ug/mL — ABNORMAL LOW (ref 10–30)

## 2020-01-17 LAB — ETHANOL: Alcohol, Ethyl (B): <10 mg/dL (ref <10)

## 2020-01-17 LAB — LIPASE, BLOOD: Lipase: 17 U/L (ref 11–51)

## 2020-01-17 LAB — RAPID URINE DRUG SCREEN, HOSP PERFORMED
Amphetamines: POSITIVE — AB
Barbiturates: NOT DETECTED
Benzodiazepines: POSITIVE — AB
Cocaine: NOT DETECTED
Opiates: NOT DETECTED
Tetrahydrocannabinol: POSITIVE — AB

## 2020-01-17 LAB — SALICYLATE LEVEL: Salicylate Lvl: <7 mg/dL — ABNORMAL LOW (ref 7.0–30.0)

## 2020-01-17 MED ORDER — IOHEXOL 300 MG/ML  SOLN
100.0000 mL | Freq: Once | INTRAMUSCULAR | Status: AC | PRN
  Administered 2020-01-17: 100 mL via INTRAVENOUS

## 2020-01-17 MED ORDER — SODIUM CHLORIDE 0.9 % IV BOLUS
1000.0000 mL | Freq: Once | INTRAVENOUS | Status: AC
  Administered 2020-01-17: 1000 mL via INTRAVENOUS

## 2020-01-17 MED ORDER — SODIUM CHLORIDE 0.9 % IV SOLN: INTRAVENOUS | Status: DC

## 2020-01-17 MED ORDER — LORAZEPAM 2 MG/ML IJ SOLN
1.0000 mg | Freq: Once | INTRAMUSCULAR | Status: AC
  Administered 2020-01-17: 1 mg via INTRAVENOUS
  Filled 2020-01-17: qty 1

## 2020-01-17 MED ORDER — SODIUM CHLORIDE (PF) 0.9 % IJ SOLN
INTRAMUSCULAR | Status: AC
  Filled 2020-01-17: qty 50

## 2020-01-17 NOTE — ED Provider Notes (Signed)
TIME SEEN: 12:06 AM  CHIEF COMPLAINT: Drug overdose, rectal bleeding and abdominal pain, headache  HPI: Patient is a 34 year old male with history of methamphetamine abuse who presents to the emergency department after reportedly swallowing methamphetamine today around 1 PM.  States that this is his method of using methamphetamine recreationally.  Denies that this was an attempt to hurt himself.  He is unable to tell me if there is any other coingestions.  Complaining of hematochezia and abdominal pain for 3 days.  No vomiting.  Also complaining of headache.  Unclear if there has been any head injury.  ROS: Level 5 caveat as patient is intoxicated and a poor historian  PAST MEDICAL HISTORY/PAST SURGICAL HISTORY:  Past Medical History:  Diagnosis Date  . Depression   . GERD (gastroesophageal reflux disease)     MEDICATIONS:  Prior to Admission medications   Medication Sig Start Date End Date Taking? Authorizing Provider  amoxicillin (AMOXIL) 500 MG capsule Take 1 capsule (500 mg total) by mouth 3 (three) times daily. 09/24/19   Fisher, Roselyn Bering, PA-C  oxyCODONE (OXY IR/ROXICODONE) 5 MG immediate release tablet Take 1 tablet (5 mg total) by mouth every 4 (four) hours as needed for severe pain. 09/24/19   Fisher, Roselyn Bering, PA-C  ranitidine (ZANTAC) 300 MG tablet Take 300 mg by mouth 2 (two) times daily.  09/24/19  [provider]    ALLERGIES:  Allergies  Allergen Reactions  . Tylenol [Acetaminophen] Anaphylaxis, Swelling and Rash  . Ibuprofen Hives, Swelling and Other (See Comments)    Pt has mild throat swelling.   Marland Kitchen Ultram [Tramadol Hcl] Hives, Swelling and Other (See Comments)    Pt has mild throat swelling.     SOCIAL HISTORY:  Social History   Tobacco Use  . Smoking status: Current Every Day Smoker    Packs/day: 0.00    Years: 6.00    Pack years: 0.00    Types: Cigarettes  . Smokeless tobacco: Never Used  Substance Use Topics  . Alcohol use: No    FAMILY  HISTORY: Family History  Problem Relation Age of Onset  . Diabetes Neg Hx     EXAM: BP (!) 137/102 (BP Location: Right Arm)   Pulse (!) 102   Temp 98.4 F (36.9 C) (Oral)   Resp 15   SpO2 100%  CONSTITUTIONAL: Alert and oriented to person.  Answer some questions appropriately but then will stop answering.  Opens eyes spontaneously moves extremities spontaneously.  GCS 14. HEAD: Normocephalic, appears atraumatic EYES: Conjunctivae clear, pupils appear equal and are approximately 4 to 5 mm bilaterally, EOM appear intact ENT: normal nose; moist mucous membranes NECK: Supple, normal ROM, no midline spinal tenderness or step-off or deformity CARD: Regular and tachycardic; S1 and S2 appreciated; no murmurs, no clicks, no rubs, no gallops RESP: Normal chest excursion without splinting or tachypnea; breath sounds clear and equal bilaterally; no wheezes, no rhonchi, no rales, no hypoxia or respiratory distress, speaking full sentences ABD/GI: Normal bowel sounds; non-distended; soft, tender to palpation diffusely, no rebound, no guarding, no peritoneal signs, no hepatosplenomegaly RECTAL: Dried dark red blood noted around the rectum without signs of active bleeding or external hemorrhoids.  Patient does not tolerate internal rectal exam. BACK:  The back appears normal, no midline spinal tenderness or step-off or deformity EXT: Normal ROM in all joints; no deformity noted, no edema; no cyanosis SKIN: Normal color for age and race; warm; no rash on exposed skin NEURO: Moves all extremities equally,  keeps his left upper and lower extremity contracted but does not appear to be having any seizure activity, no facial asymmetry, normal speech PSYCH: The patient's mood and manner are appropriate.   MEDICAL DECISION MAKING: Patient here after methamphetamine overdose.  Seems to be unintentional and done for recreational purposes.  He is tachycardic and has some contractions of his left upper and lower  extremity but this does not look like a seizure or stroke.  Is complaining of headache and is mildly hypertensive here.  Will obtain CT of the head given he is a poor historian/altered/intoxicated and I am unable to rule out trauma, ICH.  No sign of trauma on exam.  Will give IV Ativan, IV fluids.  Patient also complaining of abdominal pain and rectal bleeding.  He does have signs of dried blood around the rectum but does not tolerate an internal exam.  Tender to palpation diffusely throughout the abdomen.  Will obtain CT scan, labs, urine.  ED PROGRESS: 12:45 AM  Pt more awake and oriented x3.  Requesting to leave.  He denies SI, HI, hallucinations.  Denies that this was an intentional overdose.  Is complaining of left shoulder and hip pain and states that he feels he cannot walk or move his extremities normally.  Unable to tell me how long this has been going on.  He states he wants to go home because he is having a fight with his brother.  After lengthy conversation with patient he has decided to leave [place] and I feel at this time I do not have anything to hold him for.  He appears understand risk and benefits of leaving without further work-up.  1:00 AM  Pt planning to leave the emergency department and I was unable to stand on his own secondary to left hip pain.  Went to reevaluate patient with charge nurse present and he now agrees to stay for further work-up including labs, imaging, urine.   2:30 AM  Pt's labs unremarkable with normal hemoglobin.  Alcohol, Tylenol and salicylate levels are negative.  Platelet count and INR are normal.  No further rectal bleeding noted here.  X-rays of the left shoulder and hip show no acute abnormality.  CT of the head and abdomen pelvis also unremarkable.  Urine pending.  Patient reports feeling better.  Now able to move the left arm and leg better than he was previously.  He has no sensory deficits and does not seem to have weakness just pain in  the joints.  No sign of septic arthritis, gout, fracture.  Extremities are warm and well-perfused with equal pulses in all 4 extremities.  Heart rate now in the 90s.  He is awake and eating and drinking without difficulty.  3:50 AM  Pt ambulating in the room without any difficulty.  He has a steady, normal gait.  Does not appear to have any discomfort and is able to fully extend his arm and leg on the left side.  UDS positive for benzos, amphetamines, THC.  Urinalysis shows no sign of infection.  Will give outpatient PCP and GI follow-up.  Will also give outpatient resources for help with substance abuse.  Patient states he is ready to go home as he has to work later today.  At this time, I do not feel there is any life-threatening condition present. I have reviewed, interpreted and discussed all results (EKG, imaging, lab, urine as appropriate) and exam findings with patient/family. I have reviewed nursing notes and appropriate  previous records.  I feel the patient is safe to be discharged home without further emergent workup and can continue workup as an outpatient as needed. Discussed usual and customary return precautions. Patient/family verbalize understanding and are comfortable with this plan.  Outpatient follow-up has been provided as needed. All questions have been answered.    EKG Interpretation  Date/Time:  Thursday January 16 2020 23:59:01 EDT Ventricular Rate:  108 PR Interval:    QRS Duration: 113 QT Interval:  339 QTC Calculation: 455 R Axis:   86 Text Interpretation: Sinus tachycardia Borderline intraventricular conduction delay Artifact in lead(s) I III aVR aVL aVF V1 V2 V3 V4 V5 V6 No significant change since last tracing Confirmed by Rochele Raring [phone]) on 01/17/2020 12:10:15 AM        CRITICAL CARE Performed by: Baxter Hire Wilberth Damon   Total critical care time: 45 minutes  Critical care time was exclusive of separately billable procedures and treating other patients.  Critical  care was necessary to treat or prevent imminent or life-threatening deterioration.  Critical care was time spent personally by me on the following activities: development of treatment plan with patient and/or surrogate as well as nursing, discussions with consultants, evaluation of patient's response to treatment, examination of patient, obtaining history from patient or surrogate, ordering and performing treatments and interventions, ordering and review of laboratory studies, ordering and review of radiographic studies, pulse oximetry and re-evaluation of patient's condition.   RIYANSH GERSTNER was evaluated in Emergency Department on 01/17/2020 for the symptoms described in the history of present illness. He was evaluated in the context of the global COVID-19 pandemic, which necessitated consideration that the patient might be at risk for infection with the SARS-CoV-2 virus that causes COVID-19. Institutional protocols and algorithms that pertain to the evaluation of patients at risk for COVID-19 are in a state of rapid change based on information released by regulatory bodies including the CDC and federal and state organizations. These policies and algorithms were followed during the patient's care in the ED.  Patient was seen wearing mask, eye protection, gloves.   Aubrei Bouchie, Layla Maw, DO 01/17/20 [phone]

## 2020-01-17 NOTE — ED Notes (Signed)
PT A/O and ambulatory PT refused discharge vitals. Pt stated he will find a ride. PT ambulated out of ED. PT provided discharge instructions and verbalized understanding and provided resources.

## 2020-01-17 NOTE — ED Notes (Signed)
Pt called out stated he wanted to leave. MD came to Bedside. Ward, MD educated pt on the risk of leaving and encouraged pt to stay. Pt still requesting to leave. Pt denies SI or HI and is oriented x3. Pt singed AMA.

## 2020-01-17 NOTE — ED Notes (Addendum)
PT ambulated in hall without concerns. Pt has steady even gate.

## 2020-01-17 NOTE — Discharge Instructions (Signed)
I recommend follow-up with a primary care physician and gastroenterologist.  Your labs, urine, imaging showed no acute abnormality today.  Steps to find a Primary Care Provider (PCP):  Call [phone] or [phone] to access "[facility] Find a Doctor Service."  2.  You may also go on the [facility] website at [email or web address]  3.  [facility] also frequently accepts new patients.  [facility]  [address] [place] [address] [phone]  4.  There are also multiple [facility], [facility] and [facility] practices throughout the [place] that are frequently accepting new patients. You may find a clinic that is close to your home and contact them.  [email or web address] [phone]  [facility] [email or web address]  [email or web address] [phone]  [facility] [email or web address] [phone]  5.  Local Health Departments also can provide primary care services.  [facility]  [address] [place] [address] [phone]  [facility] Department [address] [place] [address] [phone]  [facility] [address] [place] [address]  [place] [address] [phone]

## 2020-01-17 NOTE — ED Notes (Signed)
PT transported to CT>

## 2020-01-17 NOTE — ED Notes (Signed)
Pt repeatedly asking for pain medication- specifically methadone.

## 2020-01-17 NOTE — ED Notes (Signed)
PT unable to stand or ambulate. Pt convinced to stay for further evaluation.

## 2020-01-17 NOTE — ED Notes (Signed)
Pt provided sandwich and coffee.

## 2020-01-17 NOTE — ED Notes (Signed)
PT states that if he has to live like this forever he doesn't want to anymore however, he continues to state he does not want to kill himself and does not want to die.

## 2021-01-13 ENCOUNTER — Ambulatory Visit: Payer: Self-pay | Admitting: Family

## 2021-06-06 IMAGING — CR DG HIP (WITH OR WITHOUT PELVIS) 2-3V*L*
3 series · 3 of 3 positions shown · non-contrast
Comparison: None.

CLINICAL DATA: Hip pain

EXAM:
DG HIP (WITH OR WITHOUT PELVIS) 2-3V LEFT

[x pelvis]
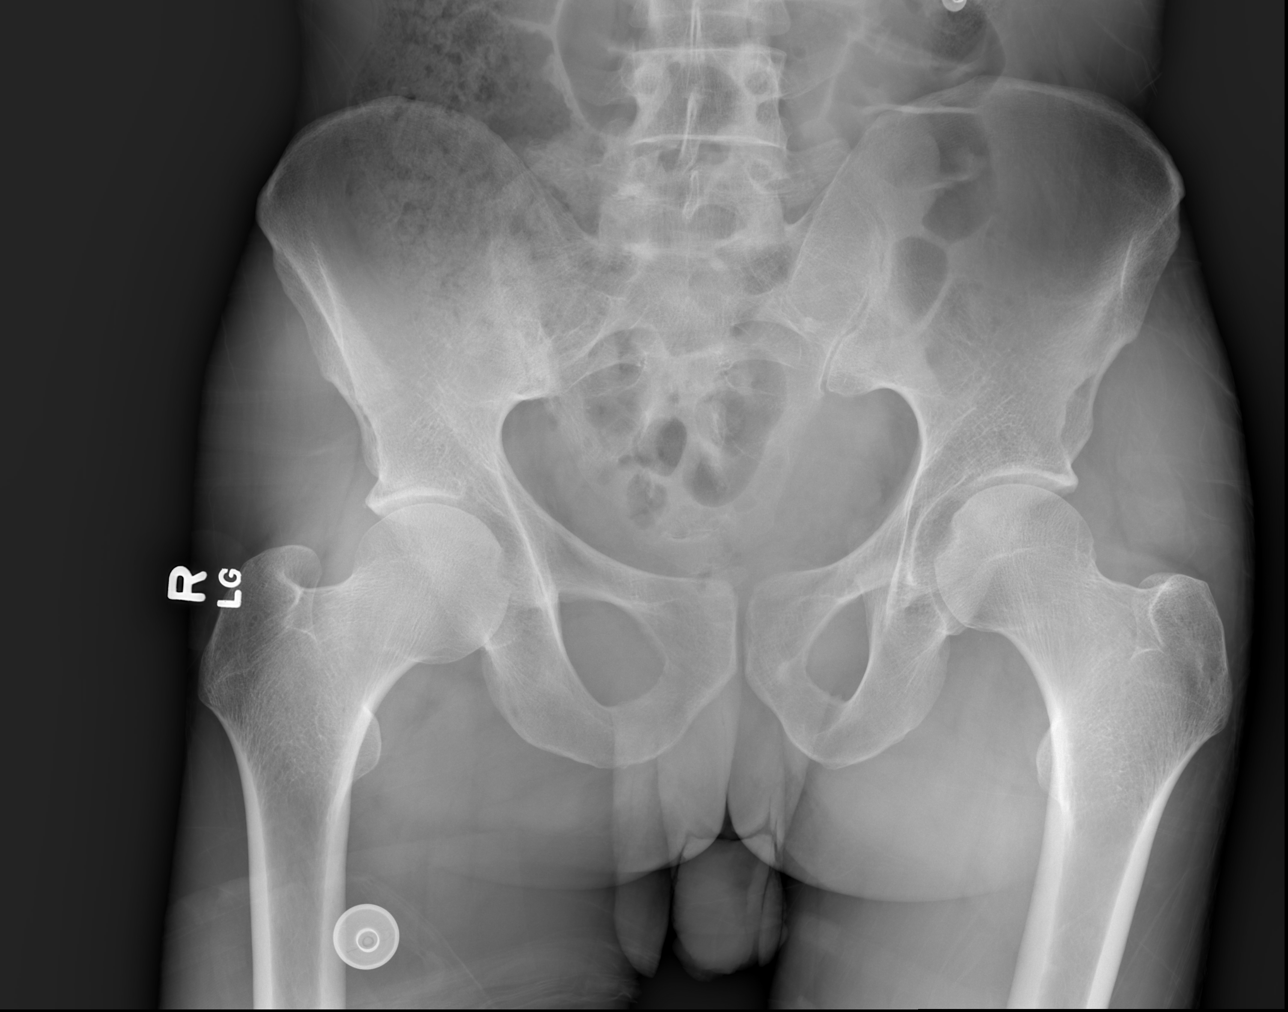

[x hip ap left (1 of 2)]
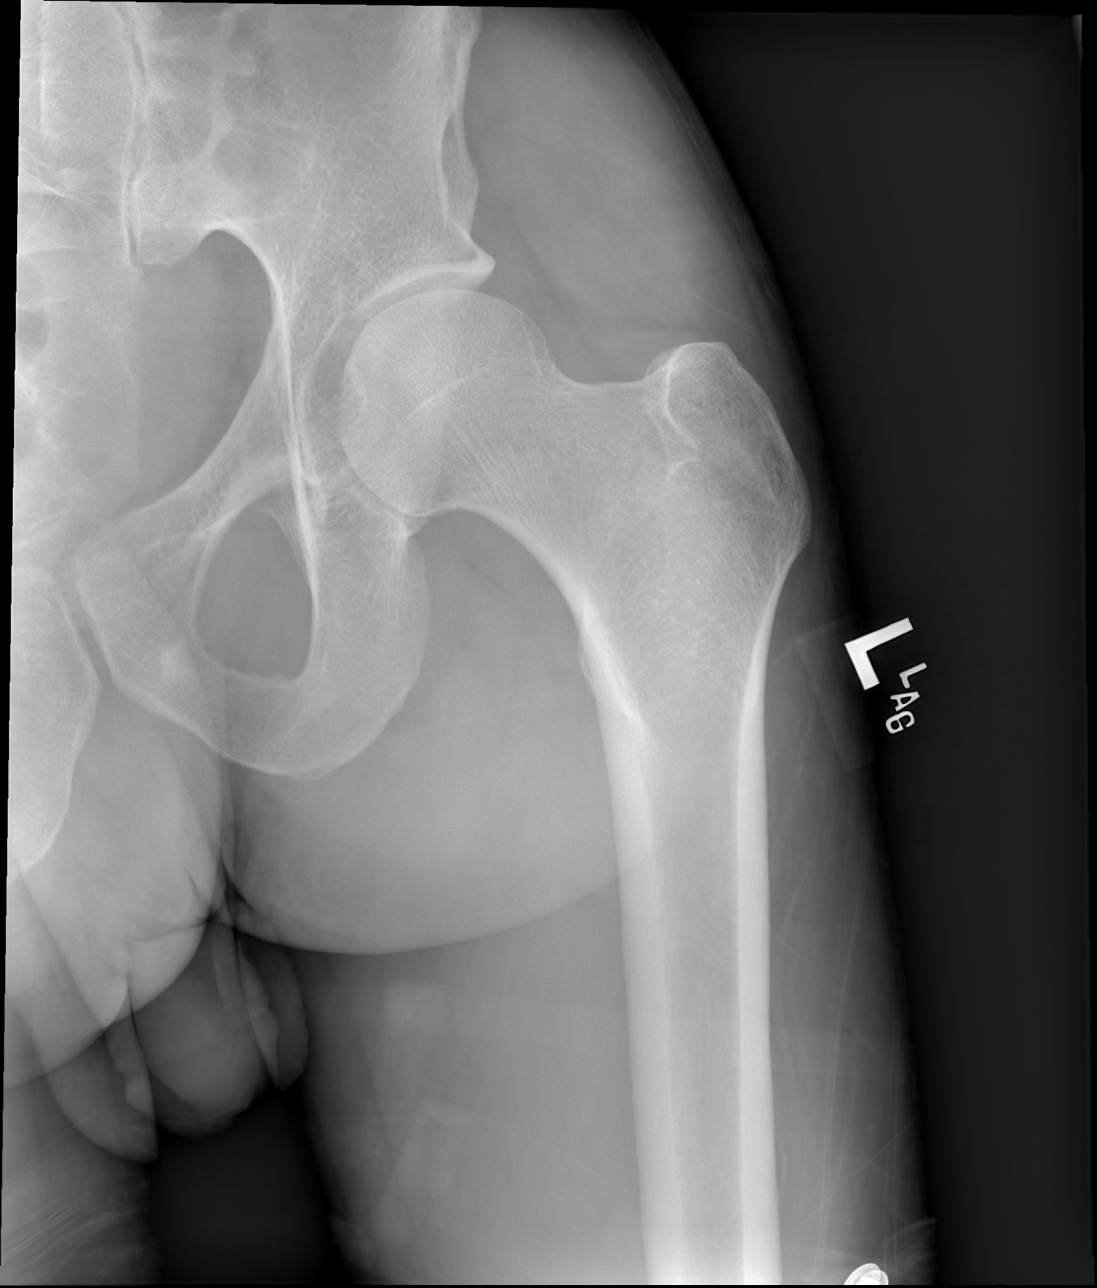

[x hip ap left (2 of 2)]
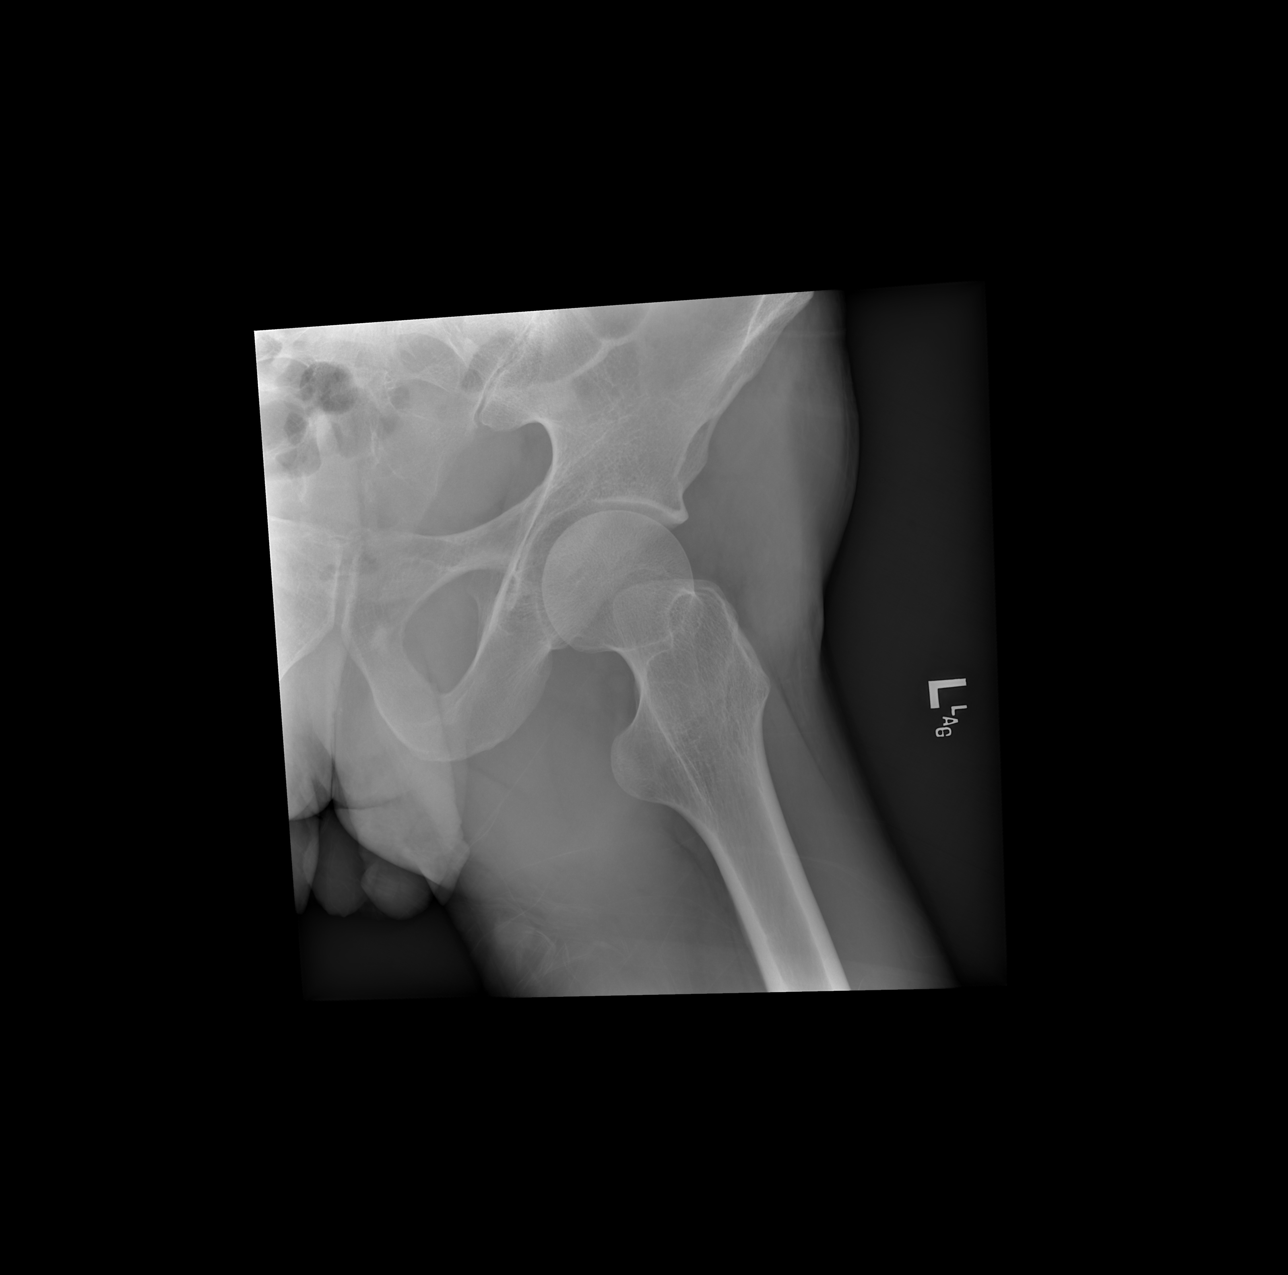

[3 of 3 positions shown; findings below may reference images not displayed]

FINDINGS: There is no evidence of hip fracture or dislocation. There is no
evidence of arthropathy or other focal bone abnormality.
IMPRESSION: Negative.

## 2021-06-06 IMAGING — CT CT ABD-PELV W/ CM
2 of 4 series · 17 of 46 positions shown, 19 images · IV contrast (OMNIPAQUE)
Comparison: None.

CLINICAL DATA: Abdominal pain, rectal bleeding.  Overdose

EXAM:
CT ABDOMEN AND PELVIS WITH CONTRAST
TECHNIQUE: Multidetector CT imaging of the abdomen and pelvis was performed
using the standard protocol following bolus administration of
intravenous contrast.
CONTRAST:  100mL OMNIPAQUE IOHEXOL 300 MG/ML  SOLN

[Series 2: axial st · axial · 0.75mm/px · z∈[+608,+984]mm · 14 of 87 slices shown, 16 images]
[im 6/87  soft-tissue]
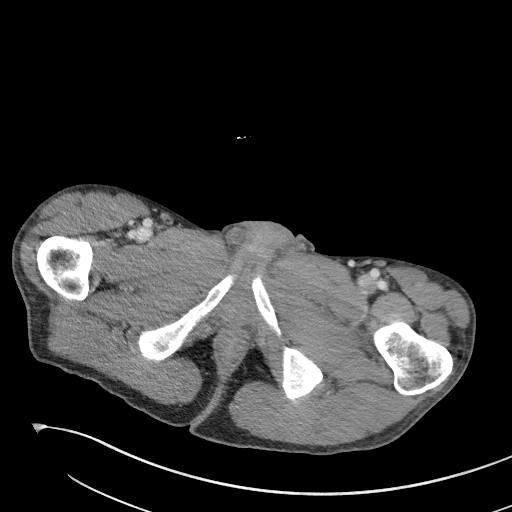
[im 6/87  bone]
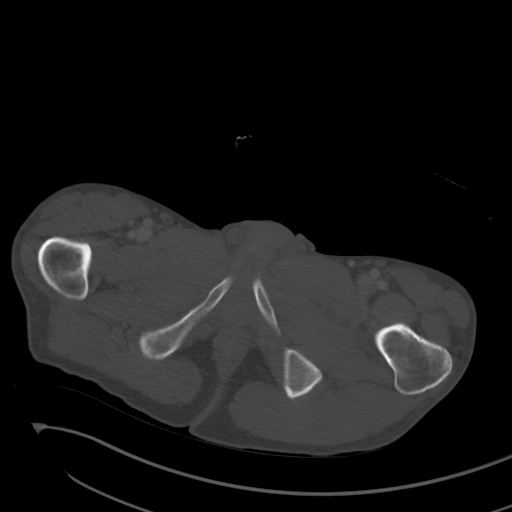
[im 11/87  soft-tissue]
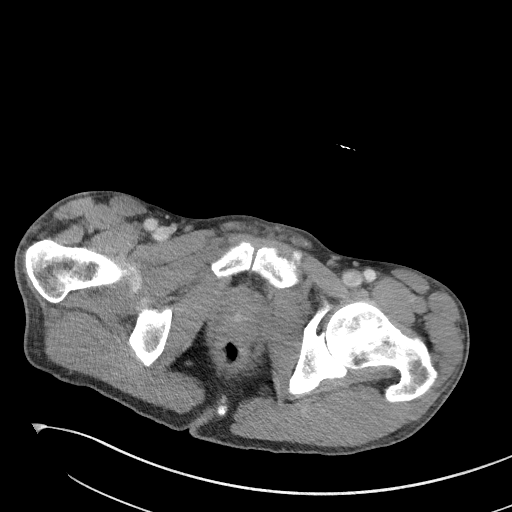
[im 16/87  soft-tissue]
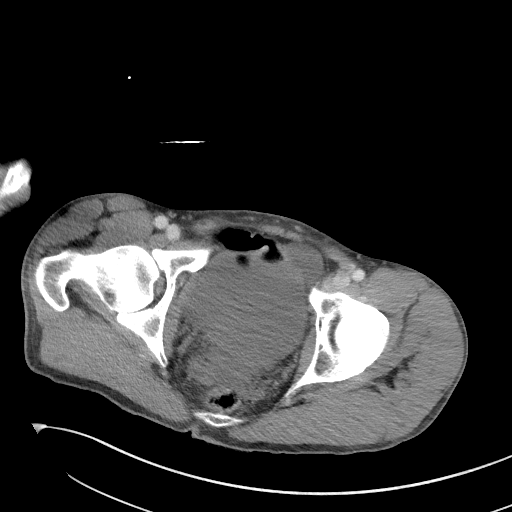
[im 26/87  soft-tissue]
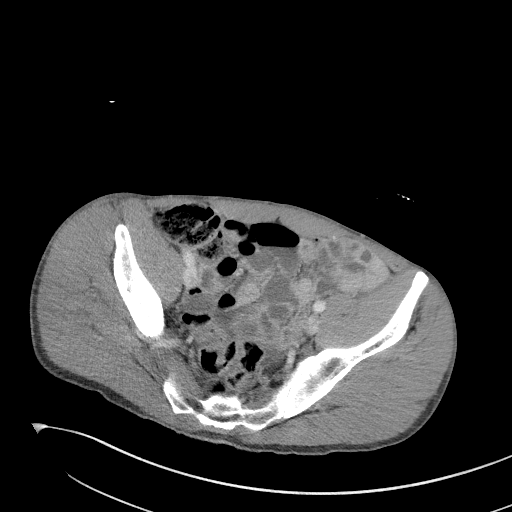
[im 31/87  soft-tissue]
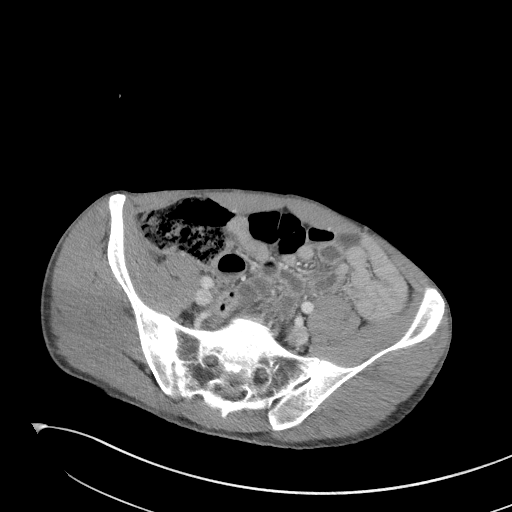
[im 36/87  soft-tissue]
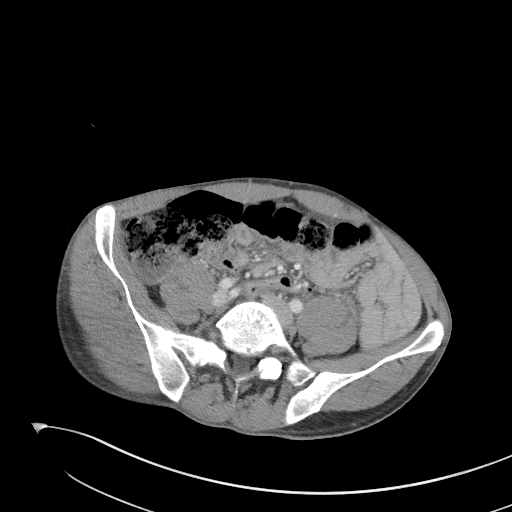
[im 41/87  soft-tissue]
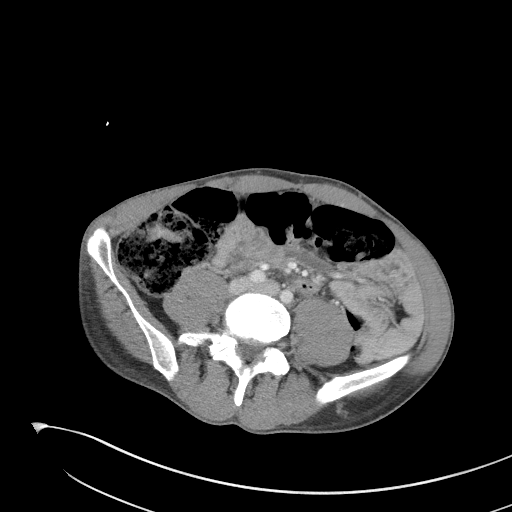
[im 46/87  soft-tissue]
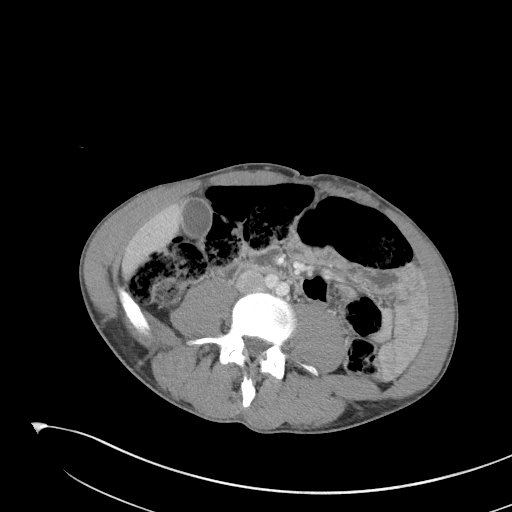
[im 51/87  soft-tissue]
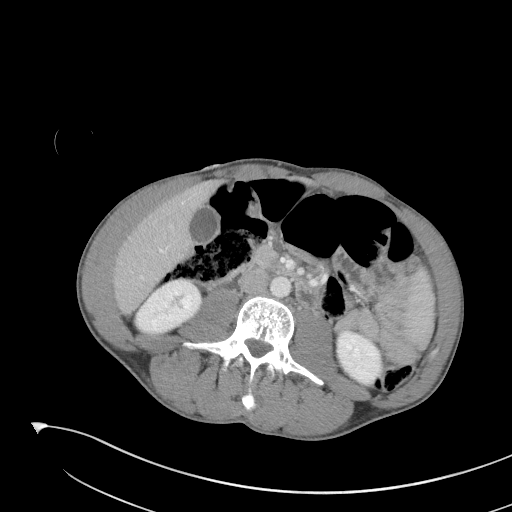
[im 51/87  bone]
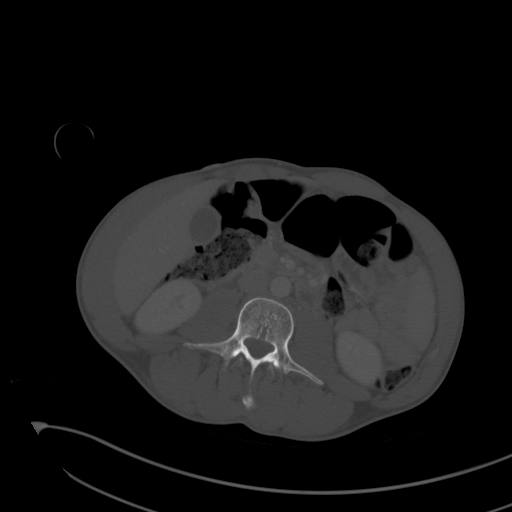
[im 56/87  soft-tissue]
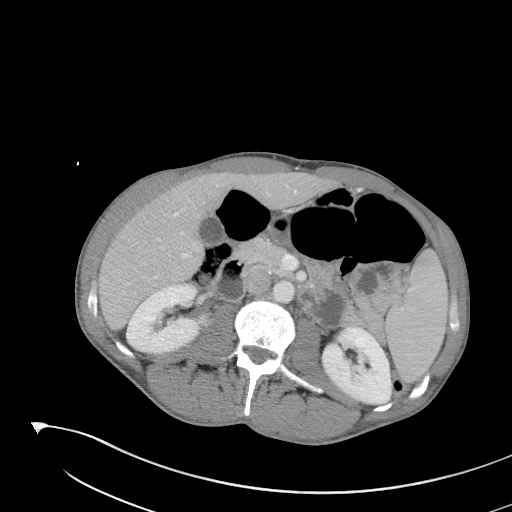
[im 66/87  soft-tissue]
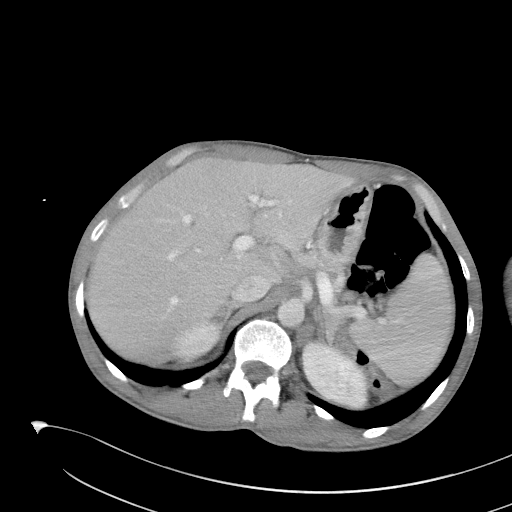
[im 71/87  soft-tissue]
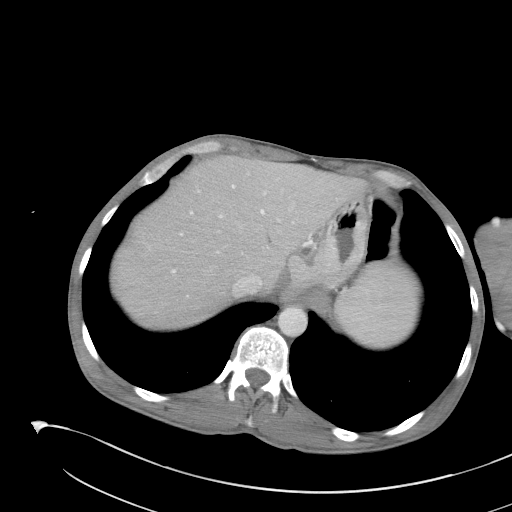
[im 76/87  soft-tissue]
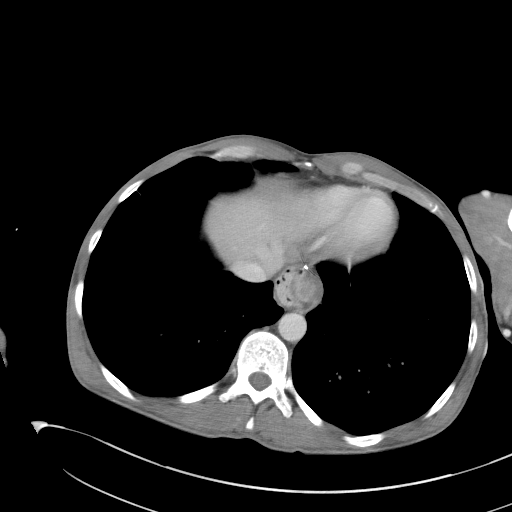
[im 81/87  soft-tissue]
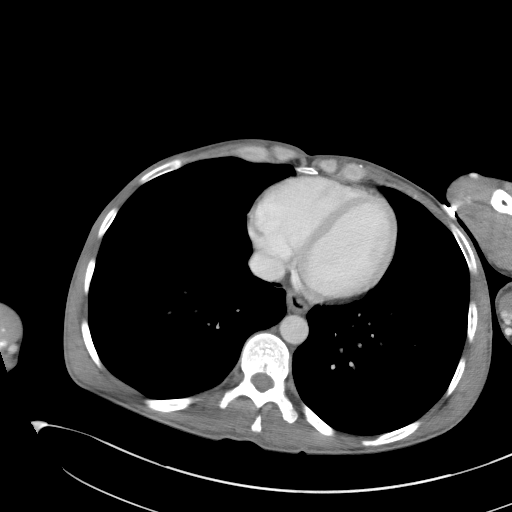

[Series 5: coronal st · coronal · 0.69mm/px · 3 of 76 slices shown]
[im 26/76  soft-tissue]
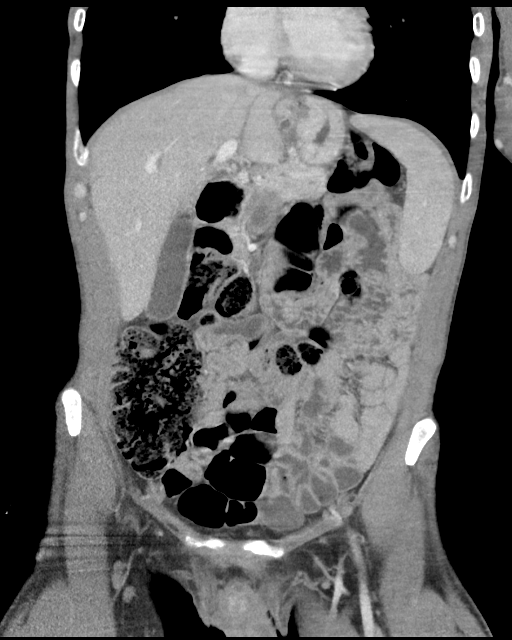
[im 34/76  soft-tissue]
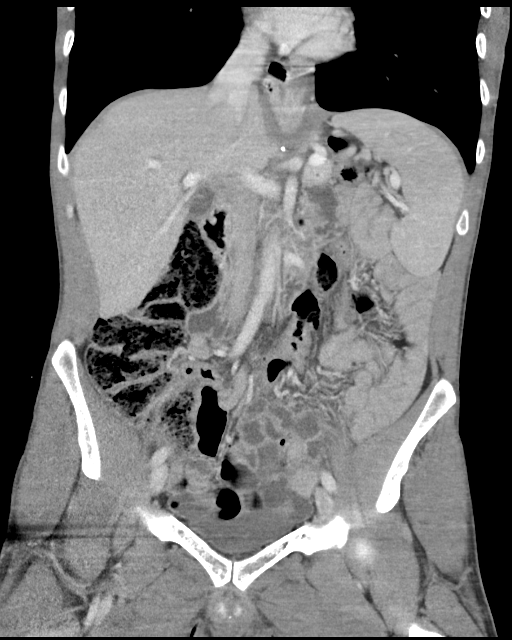
[im 42/76  soft-tissue]
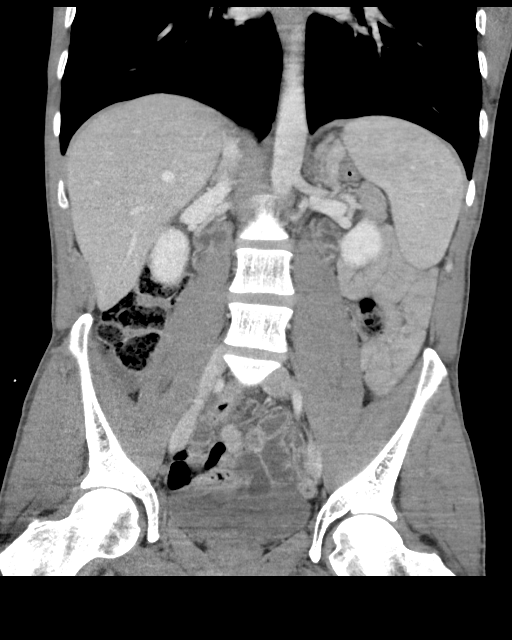

[17 of 46 positions shown; findings below may reference images not displayed]

FINDINGS: Lower chest: Lung bases are clear. No effusions. Heart is normal
size.

Hepatobiliary: No focal hepatic abnormality. Gallbladder
unremarkable.

Pancreas: No focal abnormality or ductal dilatation.

Spleen: No focal abnormality.  Normal size.

Adrenals/Urinary Tract: No adrenal abnormality. No focal renal
abnormality. No stones or hydronephrosis. Urinary bladder is
unremarkable.

Stomach/Bowel: Small hiatal hernia. Moderate stool in the right
colon. No evidence of bowel obstruction.

Vascular/Lymphatic: No evidence of aneurysm or adenopathy.

Reproductive: No visible focal abnormality.

Other: No free fluid or free air.

Musculoskeletal: No acute bony abnormality.
IMPRESSION: Moderate stool burden in the right colon.

Small hiatal hernia.

No acute findings in the abdomen or pelvis.

## 2022-05-24 DEATH — deceased
